# Patient Record
Sex: Male | Born: 1965 | Race: Asian | Hispanic: No | Marital: Married | Smoking: Never smoker
Health system: Southern US, Community
[De-identification: ages and names within clinical notes are randomized; demographics above are authoritative.]

## PROBLEM LIST (undated history)

## (undated) DIAGNOSIS — B1910 Unspecified viral hepatitis B without hepatic coma: Secondary | ICD-10-CM

## (undated) HISTORY — DX: Unspecified viral hepatitis B without hepatic coma: B19.10

---

## 2004-08-15 ENCOUNTER — Ambulatory Visit: Payer: Self-pay | Admitting: Internal Medicine

## 2006-04-12 ENCOUNTER — Ambulatory Visit: Payer: Self-pay | Admitting: Gastroenterology

## 2006-04-29 ENCOUNTER — Ambulatory Visit (HOSPITAL_COMMUNITY): Admission: RE | Admit: 2006-04-29 | Discharge: 2006-04-29 | Payer: Self-pay | Admitting: Gastroenterology

## 2010-06-18 ENCOUNTER — Encounter: Payer: Self-pay | Admitting: Gastroenterology

## 2011-08-14 ENCOUNTER — Ambulatory Visit: Payer: Self-pay

## 2011-08-14 ENCOUNTER — Ambulatory Visit: Payer: Self-pay | Admitting: Internal Medicine

## 2011-08-14 VITALS — BP 105/67 | HR 67 | Temp 98.2°F | Resp 16 | Ht 67.75 in | Wt 128.4 lb

## 2011-08-14 DIAGNOSIS — R059 Cough, unspecified: Secondary | ICD-10-CM

## 2011-08-14 DIAGNOSIS — R071 Chest pain on breathing: Secondary | ICD-10-CM

## 2011-08-14 DIAGNOSIS — B181 Chronic viral hepatitis B without delta-agent: Secondary | ICD-10-CM

## 2011-08-14 DIAGNOSIS — B1910 Unspecified viral hepatitis B without hepatic coma: Secondary | ICD-10-CM

## 2011-08-14 DIAGNOSIS — R05 Cough: Secondary | ICD-10-CM

## 2011-08-14 MED ORDER — METHYLPREDNISOLONE ACETATE 80 MG/ML IJ SUSP
80.0000 mg | Freq: Once | INTRAMUSCULAR | Status: AC
  Administered 2011-08-14: 80 mg via INTRAMUSCULAR

## 2011-08-14 MED ORDER — HYDROCODONE-ACETAMINOPHEN 7.5-500 MG/15ML PO SOLN: 5.0000 mL | Freq: Four times a day (QID) | ORAL | Status: AC | PRN

## 2011-08-14 NOTE — Progress Notes (Signed)
  Subjective:    Patient ID: Hector Salazar, male    DOB: February 04, 1966, 46 y.o.   MRN: [id number]  HPI Has cough, right upper thorax pain. No hemoptysis, sob, wheezing Had resp illness 2 wks ago Lost weight, father had lung cancer and did not smoke   Review of Systems Hepatitis B- ? active   Objective:   Physical Exam  Constitutional: He is oriented to person, place, and time. He appears well-developed and well-nourished. No distress.  HENT:  Right Ear: Tympanic membrane normal.  Left Ear: Tympanic membrane normal.  Nose: Mucosal edema and rhinorrhea present. Right sinus exhibits no frontal sinus tenderness. Left sinus exhibits no frontal sinus tenderness.  Mouth/Throat: Oropharynx is clear and moist.  Cardiovascular: Normal rate, regular rhythm and normal heart sounds.   Pulmonary/Chest: Effort normal and breath sounds normal. No respiratory distress. He has no wheezes. He has no rales. He exhibits tenderness.  Abdominal: Soft.  Musculoskeletal: Normal range of motion.  Neurological: He is alert and oriented to person, place, and time.  Skin: Skin is warm and dry.  Psychiatric: He has a normal mood and affect.  [facility] reading (PRIMARY) by  Dr.Halimah Bewick CXR.NAD            Assessment & Plan:  Cough and back pain Schedule CPE soon Depomedrol 80mg  IM Lortab elixir 6oz prn

## 2011-08-14 NOTE — Patient Instructions (Signed)
Hepatitis B Hepatitis B is a viral infection of the liver. Over half the people who become infected with hepatitis B never feel sick. However, some may later develop long-term liver disease (chronic hepatitis). There are 2 phases of the disease: sudden (acute) and longstanding (chronic). CAUSES Hepatitis B is caused by the hepatitis B virus (HBV). It can enter the body by sharing needles contaminated with blood from an infected person, by sharing intimate items such as toothbrushes and razors, or by sex with an infected person. A baby can get HBV from its birth mother. A caregiver may also get it from exposure to the blood of an infected patient by way of a cut or needle stick.  SYMPTOMS Acute Phase Many cases of acute HBV infection are mild and cause few problems.Some people may not even realize they are sick.Symptoms in others may last a few weeks to several months and include:  Loss of appetite.   Feeling very tired.   Nausea.   Vomiting.   Abdominal pain.   Dark yellow urine.   Yellow skin and eyes (jaundice).  Chronic Phase  About 5% of people who get HBV infection become "chronic carriers." They often have no symptoms, but the virus stays in their body. They may spread the virus to others and can get long-term liver disease. The younger a child is when the infection starts, the more likely that child will be a carrier.   About 25% of chronic HBV carriers get a disease called "chronic active hepatitis." These people may develop scarring of the liver (cirrhosis), liver failure, or liver cancer.  DIAGNOSIS Your caregiver can do a blood test to see if you have the disease. TREATMENT Acute hepatitis B does not usually require any drug treatment. It is important to avoid medicines such as acetaminophen that may cause increasing liver damage.  Treatment with many antiviral drugs is available and recommended for some patients with hepatitis B infection. The goal is to reduce the risk of  progressive chronic liver disease, transmission of infection to others, and other long-term complications such as cirrhosis, liver failure, and liver cancer. Drug treatment is often advised for people with:  Acute liver failure.   Clinical complications of cirrhosis.   Cirrhosis or advanced fibrosis with high serum measurements of viral DNA.   Reactivation of chronic HBV after chemotherapy or immunosuppression.  Immediate drug treatment is not often advised for patients who have chronic infection but normal liver enzyme tests or patients who have a positive hepatitis B DNA test in blood but no other signs of active infection.Patients may have other circumstances that suggest a need or potential benefit from drug treatment. Successful treatment currently requires taking treatment drugs over a long period of time. An injected drug (interferon) may be given daily, 3 times a week, or once weekly for up to 1 year. An oral drug treatment plan may require daily dosing for many years or indefinitely, in order to prevent infection reactivation and worsening of liver disease. Side effects from these drugs are common and some may be very serious. Your response to treatment must be carefully monitored by both you and your caregiver throughout the entire treatment period. PREVENTION Hepatitis B vaccine is highly effective in preventing a hepatitis B infection.The vaccine is recommended worldwide for all newborns of hepatitis B infected mothers, and in many countries for all newborns. Hepatitis B vaccine is also recommended in the [place] for other people at higher than normal risk of getting an infection, including:  Sexually active people with multiple sex partners.   Homosexual and bisexual men.   People who live with someone who has hepatitis B.   Injection drug users.   Healthcare workers.   Patients on chronic hemodialysis and patients who need repeated blood or blood product transfusions.    Patients with chronic liver disease due to any cause.   Unvaccinated people traveling to areas with high levels of local HBV infection.   Patients with diabetes.  Hepatitis B immune globulin (HBIG) is often given with hepatitis B vaccine to people who have been exposed to blood contaminated with HBV. The HBIG protects you from the virus for the first 1 to 3 months. After that, the hepatitis B vaccine takes over and gives you long-term protection. Your caregiver will help you decide whether and when to get these shots following exposure to HBV. Healthcare workers need to avoid injuries and wear appropriate protective equipment such as gloves, gowns, and face masks when performing invasive medical or nursing procedures.  HOME CARE INSTRUCTIONS   Rest when you feel tired, and eat when you are hungry.   Avoid a sexual relationship until advised otherwise by your caregiver.   Avoid activities that could expose other people to your blood. Examples include sharing a toothbrush, nail clippers, razors, and needles.   This infection is contagious. Follow your caregiver's instructions in order to avoid spread of the infection.   Do not take any medicines until your caregiver says it is okay. This includes over-the-counter drugs such as acetominophen that are usually taken for fever or pain.  SEEK IMMEDIATE MEDICAL CARE IF:   You are unable to eat or drink.   You feel sick to your stomach (nauseous) or throw up (vomit).   You feel confused.   Jaundice becomes more severe.   You have trouble breathing, a rash, or swelling of the skin, throat, mouth, or face. You may be having an allergic reaction to the medicine in the shot.   You start twitching or shaking (seizure).   You become very sleepy or have trouble waking up.  MAKE SURE YOU:   Understand these instructions.   Will watch your condition.   Will get help right away if you are not doing well or get worse.  Document Released:  05/11/2000 Document Revised: 05/03/2011 Document Reviewed: 09/12/2010 [facility] Patient Information 2012 [place], [place].

## 2012-02-04 ENCOUNTER — Ambulatory Visit (INDEPENDENT_AMBULATORY_CARE_PROVIDER_SITE_OTHER): Payer: BC Managed Care – PPO | Admitting: Internal Medicine

## 2012-02-04 ENCOUNTER — Encounter: Payer: Self-pay | Admitting: Internal Medicine

## 2012-02-04 ENCOUNTER — Ambulatory Visit: Payer: BC Managed Care – PPO

## 2012-02-04 VITALS — BP 108/74 | HR 63 | Temp 97.8°F | Resp 16 | Ht 68.0 in | Wt 131.0 lb

## 2012-02-04 DIAGNOSIS — Z0000 Encounter for general adult medical examination without abnormal findings: Secondary | ICD-10-CM

## 2012-02-04 DIAGNOSIS — F809 Developmental disorder of speech and language, unspecified: Secondary | ICD-10-CM

## 2012-02-04 DIAGNOSIS — B1910 Unspecified viral hepatitis B without hepatic coma: Secondary | ICD-10-CM

## 2012-02-04 DIAGNOSIS — Z23 Encounter for immunization: Secondary | ICD-10-CM

## 2012-02-04 DIAGNOSIS — B181 Chronic viral hepatitis B without delta-agent: Secondary | ICD-10-CM

## 2012-02-04 LAB — POCT URINALYSIS DIPSTICK
Glucose, UA: NEGATIVE
Leukocytes, UA: NEGATIVE
Nitrite, UA: NEGATIVE
Protein, UA: NEGATIVE
Urobilinogen, UA: 0.2

## 2012-02-04 LAB — CBC WITH DIFFERENTIAL/PLATELET
Basophils Relative: 1 % (ref 0–1)
Eosinophils Absolute: 0.1 10*3/uL (ref 0.0–0.7)
Eosinophils Relative: 2 % (ref 0–5)
HCT: 43.8 % (ref 39.0–52.0)
Lymphocytes Relative: 26 % (ref 12–46)
Lymphs Abs: 1.7 10*3/uL (ref 0.7–4.0)
MCH: 32 pg (ref 26.0–34.0)
Monocytes Relative: 6 % (ref 3–12)
RBC: 4.97 MIL/uL (ref 4.22–5.81)
RDW: 12.4 % (ref 11.5–15.5)
WBC: 6.5 10*3/uL (ref 4.0–10.5)

## 2012-02-04 LAB — POCT UA - MICROSCOPIC ONLY
Bacteria, U Microscopic: NEGATIVE
RBC, urine, microscopic: NEGATIVE

## 2012-02-04 NOTE — Progress Notes (Signed)
  Subjective:    Patient ID: Hector Salazar, male    DOB: 11/25/1965, 46 y.o.   MRN: [id number]  HPI [place], owns [employer]. Has hepatitis B active Feels good. No smoke and no alcohol use Slender and works hard.      See scanned hx    Review of Systems See scanned ros    Objective:   Physical Exam  Constitutional: He is oriented to person, place, and time. He appears well-developed and well-nourished. No distress.  HENT:  Right Ear: External ear normal.  Left Ear: External ear normal.  Nose: Nose normal.  Mouth/Throat: Oropharynx is clear and moist.  Eyes: EOM are normal. Pupils are equal, round, and reactive to light. No scleral icterus.  Neck: Normal range of motion. Neck supple. No tracheal deviation present. No thyromegaly present.  Cardiovascular: Normal rate, regular rhythm and normal heart sounds.   Pulmonary/Chest: Effort normal and breath sounds normal. No respiratory distress. He exhibits no tenderness.  Abdominal: Soft. Bowel sounds are normal. He exhibits no distension. There is no tenderness. There is no guarding.  Genitourinary: Rectum normal, prostate normal and penis normal.  Lymphadenopathy:    He has no cervical adenopathy.  Neurological: He is alert and oriented to person, place, and time. He has normal reflexes. No cranial nerve deficit. He exhibits normal muscle tone. Coordination normal.  Skin: Skin is warm and dry. No rash noted.  Psychiatric: He has a normal mood and affect. His behavior is normal. Judgment and thought content normal.   Hepatitis screening All routine labs ekg nl       Assessment & Plan:  Refer to [facility] hepatitis clinic  Schedule US liver Hep A 1st immunization/ Flu vaccine

## 2012-02-05 LAB — COMPREHENSIVE METABOLIC PANEL
ALT: 15 U/L (ref 0–53)
AST: 21 U/L (ref 0–37)
Albumin: 4.6 g/dL (ref 3.5–5.2)
Alkaline Phosphatase: 55 U/L (ref 39–117)
Calcium: 9.8 mg/dL (ref 8.4–10.5)
Creat: 0.83 mg/dL (ref 0.50–1.35)
Potassium: 3.9 mEq/L (ref 3.5–5.3)
Total Bilirubin: 0.7 mg/dL (ref 0.3–1.2)
Total Protein: 7.5 g/dL (ref 6.0–8.3)

## 2012-02-05 LAB — MICROALBUMIN, URINE: Microalb, Ur: 0.5 mg/dL (ref 0.00–1.89)

## 2012-02-05 LAB — LIPID PANEL
LDL Cholesterol: 153 mg/dL — ABNORMAL HIGH (ref 0–99)
Triglycerides: 65 mg/dL (ref <150)
VLDL: 13 mg/dL (ref 0–40)

## 2012-02-05 LAB — HEPATITIS B SURFACE ANTIGEN: Hepatitis B Surface Ag: POSITIVE — AB

## 2012-02-05 LAB — HEPATITIS B SURFACE ANTIBODY, QUANTITATIVE: Hepatitis B-Post: 0.2 m[IU]/mL

## 2012-02-06 ENCOUNTER — Telehealth: Payer: Self-pay | Admitting: Radiology

## 2012-02-06 DIAGNOSIS — B1910 Unspecified viral hepatitis B without hepatic coma: Secondary | ICD-10-CM

## 2012-02-06 NOTE — Telephone Encounter (Signed)
I received a sticky note, with orders on it from DrGuest. The orders are put in for patient.

## 2012-02-09 NOTE — Progress Notes (Signed)
  Subjective:    Patient ID: Hector Salazar, male    DOB: 11/16/1965, 46 y.o.   MRN: [id number]  HPI    Review of Systems     Objective:   Physical Exam  [facility] reading (PRIMARY) by  Dr Perrin Maltese xr nl        Assessment & Plan:

## 2012-02-29 ENCOUNTER — Other Ambulatory Visit: Payer: Self-pay | Admitting: Internal Medicine

## 2012-02-29 ENCOUNTER — Ambulatory Visit (HOSPITAL_COMMUNITY)
Admission: RE | Admit: 2012-02-29 | Discharge: 2012-02-29 | Disposition: A | Discharge status: Home / Self Care | Payer: BC Managed Care – PPO | Source: Ambulatory Visit | Attending: Internal Medicine | Admitting: Internal Medicine

## 2012-02-29 DIAGNOSIS — B1910 Unspecified viral hepatitis B without hepatic coma: Secondary | ICD-10-CM

## 2012-03-17 ENCOUNTER — Ambulatory Visit: Payer: BC Managed Care – PPO | Admitting: Internal Medicine

## 2012-03-21 ENCOUNTER — Encounter: Payer: Self-pay | Admitting: Internal Medicine

## 2012-03-21 ENCOUNTER — Ambulatory Visit (INDEPENDENT_AMBULATORY_CARE_PROVIDER_SITE_OTHER): Payer: BC Managed Care – PPO | Admitting: Internal Medicine

## 2012-03-21 VITALS — BP 103/72 | HR 60 | Temp 97.4°F | Resp 16 | Ht 68.0 in | Wt 132.0 lb

## 2012-03-21 DIAGNOSIS — Z719 Counseling, unspecified: Secondary | ICD-10-CM

## 2012-03-21 DIAGNOSIS — E785 Hyperlipidemia, unspecified: Secondary | ICD-10-CM

## 2012-03-21 DIAGNOSIS — B1910 Unspecified viral hepatitis B without hepatic coma: Secondary | ICD-10-CM

## 2012-03-21 DIAGNOSIS — E7800 Pure hypercholesterolemia, unspecified: Secondary | ICD-10-CM

## 2012-03-21 DIAGNOSIS — B181 Chronic viral hepatitis B without delta-agent: Secondary | ICD-10-CM

## 2012-03-21 DIAGNOSIS — R7989 Other specified abnormal findings of blood chemistry: Secondary | ICD-10-CM

## 2012-03-21 NOTE — Patient Instructions (Addendum)
Fat and Cholesterol Control Diet Cholesterol levels in your body are determined significantly by your diet. Cholesterol levels may also be related to heart disease. The following material helps to explain this relationship and discusses what you can do to help keep your heart healthy. Not all cholesterol is bad. Low-density lipoprotein (LDL) cholesterol is the "bad" cholesterol. It may cause fatty deposits to build up inside your arteries. High-density lipoprotein (HDL) cholesterol is "good." It helps to remove the "bad" LDL cholesterol from your blood. Cholesterol is a very important risk factor for heart disease. Other risk factors are high blood pressure, smoking, stress, heredity, and weight. The heart muscle gets its supply of blood through the coronary arteries. If your LDL cholesterol is high and your HDL cholesterol is low, you are at risk for having fatty deposits build up in your coronary arteries. This leaves less room through which blood can flow. Without sufficient blood and oxygen, the heart muscle cannot function properly and you may feel chest pains (angina pectoris). When a coronary artery closes up entirely, a part of the heart muscle may die causing a heart attack (myocardial infarction). CHECKING CHOLESTEROL When your caregiver sends your blood to a lab to be examined for cholesterol, a complete lipid (fat) profile may be done. With this test, the total amount of cholesterol and levels of LDL and HDL are determined. Triglycerides are a type of fat that circulates in the blood. They can also be used to determine heart disease risk. The list below describes what the numbers should be: Test: Total Cholesterol.  Less than 200 mg/dl. Test: LDL "bad cholesterol."  Less than 100 mg/dl.  Less than 70 mg/dl if you are at very high risk of a heart attack or sudden cardiac death. Test: HDL "good cholesterol."  Greater than 50 mg/dl for women.  Greater than 40 mg/dl for men. Test:  Triglycerides.  Less than 150 mg/dl. CONTROLLING CHOLESTEROL WITH DIET Although exercise and lifestyle factors are important, your diet is key. That is because certain foods are known to raise cholesterol and others to lower it. The goal is to balance foods for their effect on cholesterol and more importantly, to replace saturated and trans fat with other types of fat, such as monounsaturated fat, polyunsaturated fat, and omega-3 fatty acids. On average, a person should consume no more than 15 to 17 g of saturated fat daily. Saturated and trans fats are considered "bad" fats, and they will raise LDL cholesterol. Saturated fats are primarily found in animal products such as meats, butter, and cream. However, that does not mean you need to give up all your favorite foods. Today, there are good tasting, low-fat, low-cholesterol substitutes for most of the things you like to eat. Choose low-fat or nonfat alternatives. Choose round or loin cuts of red meat. These types of cuts are lowest in fat and cholesterol. Chicken (without the skin), fish, veal, and ground turkey breast are great choices. Eliminate fatty meats, such as hot dogs and salami. Even shellfish have little or no saturated fat. Have a 3 oz (85 g) portion when you eat lean meat, poultry, or fish. Trans fats are also called "partially hydrogenated oils." They are oils that have been scientifically manipulated so that they are solid at room temperature resulting in a longer shelf life and improved taste and texture of foods in which they are added. Trans fats are found in stick margarine, some tub margarines, cookies, crackers, and baked goods.  When baking and cooking, oils   are a great substitute for butter. The monounsaturated oils are especially beneficial since it is believed they lower LDL and raise HDL. The oils you should avoid entirely are saturated tropical oils, such as coconut and palm.  Remember to eat a lot from food groups that are  naturally free of saturated and trans fat, including fish, fruit, vegetables, beans, grains (barley, rice, couscous, bulgur wheat), and pasta (without cream sauces).  IDENTIFYING FOODS THAT LOWER CHOLESTEROL  Soluble fiber may lower your cholesterol. This type of fiber is found in fruits such as apples, vegetables such as broccoli, potatoes, and carrots, legumes such as beans, peas, and lentils, and grains such as barley. Foods fortified with plant sterols (phytosterol) may also lower cholesterol. You should eat at least 2 g per day of these foods for a cholesterol lowering effect.  Read package labels to identify low-saturated fats, trans fat free, and low-fat foods at the supermarket. Select cheeses that have only 2 to 3 g saturated fat per ounce. Use a heart-healthy tub margarine that is free of trans fats or partially hydrogenated oil. When buying baked goods (cookies, crackers), avoid partially hydrogenated oils. Breads and muffins should be made from whole grains (whole-wheat or whole oat flour, instead of "flour" or "enriched flour"). Buy non-creamy canned soups with reduced salt and no added fats.  FOOD PREPARATION TECHNIQUES  Never deep-fry. If you must fry, either stir-fry, which uses very little fat, or use non-stick cooking sprays. When possible, broil, bake, or roast meats, and steam vegetables. Instead of putting butter or margarine on vegetables, use lemon and herbs, applesauce, and cinnamon (for squash and sweet potatoes), nonfat yogurt, salsa, and low-fat dressings for salads.  LOW-SATURATED FAT / LOW-FAT FOOD SUBSTITUTES Meats / Saturated Fat (g)  Avoid: Steak, marbled (3 oz/85 g) / 11 g  Choose: Steak, lean (3 oz/85 g) / 4 g  Avoid: Hamburger (3 oz/85 g) / 7 g  Choose: Hamburger, lean (3 oz/85 g) / 5 g  Avoid: Ham (3 oz/85 g) / 6 g  Choose: Ham, lean cut (3 oz/85 g) / 2.4 g  Avoid: Chicken, with skin, dark meat (3 oz/85 g) / 4 g  Choose: Chicken, skin removed, dark meat (3  oz/85 g) / 2 g  Avoid: Chicken, with skin, light meat (3 oz/85 g) / 2.5 g  Choose: Chicken, skin removed, light meat (3 oz/85 g) / 1 g Dairy / Saturated Fat (g)  Avoid: Whole milk (1 cup) / 5 g  Choose: Low-fat milk, 2% (1 cup) / 3 g  Choose: Low-fat milk, 1% (1 cup) / 1.5 g  Choose: Skim milk (1 cup) / 0.3 g  Avoid: Hard cheese (1 oz/28 g) / 6 g  Choose: Skim milk cheese (1 oz/28 g) / 2 to 3 g  Avoid: Cottage cheese, 4% fat (1 cup) / 6.5 g  Choose: Low-fat cottage cheese, 1% fat (1 cup) / 1.5 g  Avoid: Ice cream (1 cup) / 9 g  Choose: Sherbet (1 cup) / 2.5 g  Choose: Nonfat frozen yogurt (1 cup) / 0.3 g  Choose: Frozen fruit bar / trace  Avoid: Whipped cream (1 tbs) / 3.5 g  Choose: Nondairy whipped topping (1 tbs) / 1 g Condiments / Saturated Fat (g)  Avoid: Mayonnaise (1 tbs) / 2 g  Choose: Low-fat mayonnaise (1 tbs) / 1 g  Avoid: Butter (1 tbs) / 7 g  Choose: Extra light margarine (1 tbs) / 1 g  Avoid: Coconut oil (1   tbs) / 11.8 g  Choose: Olive oil (1 tbs) / 1.8 g  Choose: Corn oil (1 tbs) / 1.7 g  Choose: Safflower oil (1 tbs) / 1.2 g  Choose: Sunflower oil (1 tbs) / 1.4 g  Choose: Soybean oil (1 tbs) / 2.4 g  Choose: Canola oil (1 tbs) / 1 g Document Released: 05/14/2005 Document Revised: 08/06/2011 Document Reviewed: 11/02/2010 [employer] Patient Information 2013 [employer]. Hypothyroidism The thyroid is a large gland located in the lower front of your neck. The thyroid gland helps control metabolism. Metabolism is how your body handles food. It controls metabolism with the hormone thyroxine. When this gland is underactive (hypothyroid), it produces too little hormone.  CAUSES These include:   Absence or destruction of thyroid tissue.  Goiter due to iodine deficiency.  Goiter due to medications.  Congenital defects (since birth).  Problems with the pituitary. This causes a lack of TSH (thyroid stimulating hormone). This hormone tells  the thyroid to turn out more hormone. SYMPTOMS  Lethargy (feeling as though you have no energy)  Cold intolerance  Weight gain (in spite of normal food intake)  Dry skin  Coarse hair  Menstrual irregularity (if severe, may lead to infertility)  Slowing of thought processes Cardiac problems are also caused by insufficient amounts of thyroid hormone. Hypothyroidism in the newborn is cretinism, and is an extreme form. It is important that this form be treated adequately and immediately or it will lead rapidly to retarded physical and mental development. DIAGNOSIS  To prove hypothyroidism, your caregiver may do blood tests and ultrasound tests. Sometimes the signs are hidden. It may be necessary for your caregiver to watch this illness with blood tests either before or after diagnosis and treatment. TREATMENT  Low levels of thyroid hormone are increased by using synthetic thyroid hormone. This is a safe, effective treatment. It usually takes about four weeks to gain the full effects of the medication. After you have the full effect of the medication, it will generally take another four weeks for problems to leave. Your caregiver may start you on low doses. If you have had heart problems the dose may be gradually increased. It is generally not an emergency to get rapidly to normal. HOME CARE INSTRUCTIONS   Take your medications as your caregiver suggests. Let your caregiver know of any medications you are taking or start taking. Your caregiver will help you with dosage schedules.  As your condition improves, your dosage needs may increase. It will be necessary to have continuing blood tests as suggested by your caregiver.  Report all suspected medication side effects to your caregiver. SEEK MEDICAL CARE IF: Seek medical care if you develop:  Sweating.  Tremulousness (tremors).  Anxiety.  Rapid weight loss.  Heat intolerance.  Emotional swings.  Diarrhea.  Weakness. SEEK  IMMEDIATE MEDICAL CARE IF:  You develop chest pain, an irregular heart beat (palpitations), or a rapid heart beat. MAKE SURE YOU:   Understand these instructions.  Will watch your condition.  Will get help right away if you are not doing well or get worse. Document Released: 05/14/2005 Document Revised: 08/06/2011 Document Reviewed: 01/02/2008 [employer] Patient Information 2013 [employer].  

## 2012-03-21 NOTE — Progress Notes (Signed)
  Subjective:    Patient ID: Hector Salazar, male    DOB: [birth year], 46 y.o.   MRN: [id number]  HPI Mild elevation of cholesterol ans tsh, hepatitis b evaluation done by [facility] and no tx needed/us neg. Feels good and has no sxs.   Review of Systems     Objective:   Physical Exam  Constitutional: He is oriented to person, place, and time. He appears well-developed and well-nourished. No distress.  HENT:  Nose: Nose normal.  Mouth/Throat: Oropharynx is clear and moist.  Eyes: EOM are normal. No scleral icterus.  Neck: Neck supple. No thyromegaly present.  Cardiovascular: Normal rate, regular rhythm and normal heart sounds.   Pulmonary/Chest: Effort normal and breath sounds normal.  Abdominal: Soft. Bowel sounds are normal. He exhibits no mass. There is no tenderness.  Neurological: He is alert and oriented to person, place, and time.  Psychiatric: He has a normal mood and affect.    No tests      Assessment & Plan:  High cholesterol with strong HDL High TSH Low cholesterol diet Repeat tsh and lipids 6 mo

## 2012-03-25 ENCOUNTER — Encounter: Payer: Self-pay | Admitting: Family Medicine

## 2012-03-25 DIAGNOSIS — B1910 Unspecified viral hepatitis B without hepatic coma: Secondary | ICD-10-CM

## 2012-04-15 ENCOUNTER — Ambulatory Visit (INDEPENDENT_AMBULATORY_CARE_PROVIDER_SITE_OTHER): Payer: BC Managed Care – PPO | Admitting: Internal Medicine

## 2012-04-15 VITALS — BP 124/76 | HR 74 | Temp 97.6°F | Resp 17 | Ht 68.0 in | Wt 128.0 lb

## 2012-04-15 DIAGNOSIS — J40 Bronchitis, not specified as acute or chronic: Secondary | ICD-10-CM

## 2012-04-15 MED ORDER — AZITHROMYCIN 250 MG PO TABS: ORAL_TABLET | ORAL | Status: DC

## 2012-04-15 MED ORDER — HYDROCODONE-ACETAMINOPHEN 7.5-325 MG/15ML PO SOLN: 15.0000 mL | Freq: Four times a day (QID) | ORAL | Status: DC | PRN

## 2012-04-15 NOTE — Patient Instructions (Signed)

## 2012-04-15 NOTE — Progress Notes (Signed)
  Subjective:    Patient ID: Hector Salazar, male    DOB: 07/18/1965, 46 y.o.   MRN: [id number]  HPI Cough for one week No sob,cp No fever, scant yellow sputum   Review of Systems     Objective:   Physical Exam Normal       Assessment & Plan:  zpak/lortab

## 2012-09-15 ENCOUNTER — Ambulatory Visit: Payer: BC Managed Care – PPO | Admitting: Internal Medicine

## 2012-11-17 ENCOUNTER — Ambulatory Visit: Payer: BC Managed Care – PPO | Admitting: Internal Medicine

## 2013-01-12 ENCOUNTER — Ambulatory Visit (INDEPENDENT_AMBULATORY_CARE_PROVIDER_SITE_OTHER): Payer: BC Managed Care – PPO | Admitting: Internal Medicine

## 2013-01-12 ENCOUNTER — Encounter: Payer: Self-pay | Admitting: Internal Medicine

## 2013-01-12 VITALS — BP 112/72 | HR 62 | Temp 97.9°F | Resp 16 | Ht 68.0 in | Wt 142.0 lb

## 2013-01-12 DIAGNOSIS — B1910 Unspecified viral hepatitis B without hepatic coma: Secondary | ICD-10-CM

## 2013-01-12 DIAGNOSIS — R946 Abnormal results of thyroid function studies: Secondary | ICD-10-CM

## 2013-01-12 DIAGNOSIS — E785 Hyperlipidemia, unspecified: Secondary | ICD-10-CM

## 2013-01-12 DIAGNOSIS — R7989 Other specified abnormal findings of blood chemistry: Secondary | ICD-10-CM

## 2013-01-12 LAB — LIPID PANEL
Cholesterol: 198 mg/dL (ref 0–200)
LDL Cholesterol: 116 mg/dL — ABNORMAL HIGH (ref 0–99)
Total CHOL/HDL Ratio: 4.3 Ratio
Triglycerides: 180 mg/dL — ABNORMAL HIGH (ref <150)
VLDL: 36 mg/dL (ref 0–40)

## 2013-01-12 LAB — COMPREHENSIVE METABOLIC PANEL
AST: 21 U/L (ref 0–37)
Albumin: 4.2 g/dL (ref 3.5–5.2)
Alkaline Phosphatase: 56 U/L (ref 39–117)
Glucose, Bld: 82 mg/dL (ref 70–99)
Potassium: 4 mEq/L (ref 3.5–5.3)
Sodium: 139 mEq/L (ref 135–145)
Total Bilirubin: 0.7 mg/dL (ref 0.3–1.2)
Total Protein: 6.2 g/dL (ref 6.0–8.3)

## 2013-01-12 LAB — CBC
Hemoglobin: 14.6 g/dL (ref 13.0–17.0)
MCH: 31.2 pg (ref 26.0–34.0)
MCHC: 35.2 g/dL (ref 30.0–36.0)
RDW: 13 % (ref 11.5–15.5)

## 2013-01-12 NOTE — Progress Notes (Signed)
  Subjective:    Patient ID: Hector Salazar, male    DOB: Oct 11, 1965, 47 y.o.   MRN: [id number]  HPI Feels well and doing well. 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 No tx needed for hepatitis B. High TSH and Lipids needs repeat   Review of Systems     Objective:   Physical Exam  Vitals reviewed. Constitutional: He is oriented to person, place, and time. He appears well-developed and well-nourished. No distress.  HENT:  Head: Normocephalic.  Eyes: EOM are normal. Pupils are equal, round, and reactive to light. No scleral icterus.  Neck: Neck supple. No thyromegaly present.  Cardiovascular: Normal rate, regular rhythm and normal heart sounds.   Pulmonary/Chest: Effort normal.  Musculoskeletal: Normal range of motion.  Lymphadenopathy:    He has no cervical adenopathy.  Neurological: He is alert and oriented to person, place, and time. He exhibits normal muscle tone. Coordination normal.  Skin: No rash noted.  Psychiatric: He has a normal mood and affect.     TSH/Lipids reck     Assessment & Plan:  Schedule [place] of liver for Hepatitis B eval for [facility]

## 2013-01-12 NOTE — Patient Instructions (Signed)
DASH Diet  The DASH diet stands for "Dietary Approaches to Stop Hypertension." It is a healthy eating plan that has been shown to reduce high blood pressure (hypertension) in as little as 14 days, while also possibly providing other significant health benefits. These other health benefits include reducing the risk of breast cancer after menopause and reducing the risk of type 2 diabetes, heart disease, colon cancer, and stroke. Health benefits also include weight loss and slowing kidney failure in patients with chronic kidney disease.   DIET GUIDELINES  · Limit salt (sodium). Your diet should contain less than 1500 mg of sodium daily.  · Limit refined or processed carbohydrates. Your diet should include mostly whole grains. Desserts and added sugars should be used sparingly.  · Include small amounts of heart-healthy fats. These types of fats include nuts, oils, and tub margarine. Limit saturated and trans fats. These fats have been shown to be harmful in the body.  CHOOSING FOODS   The following food groups are based on a 2000 calorie diet. See your Registered Dietitian for individual calorie needs.  Grains and Grain Products (6 to 8 servings daily)  · Eat More Often: Whole-wheat bread, brown rice, whole-grain or wheat pasta, quinoa, popcorn without added fat or salt (air popped).  · Eat Less Often: White bread, white pasta, white rice, cornbread.  Vegetables (4 to 5 servings daily)  · Eat More Often: Fresh, frozen, and canned vegetables. Vegetables may be raw, steamed, roasted, or grilled with a minimal amount of fat.  · Eat Less Often/Avoid: Creamed or fried vegetables. Vegetables in a cheese sauce.  Fruit (4 to 5 servings daily)  · Eat More Often: All fresh, canned (in natural juice), or frozen fruits. Dried fruits without added sugar. One hundred percent fruit juice (½ cup [237 mL] daily).  · Eat Less Often: Dried fruits with added sugar. Canned fruit in light or heavy syrup.  Lean Meats, Fish, and Poultry (2  servings or less daily. One serving is 3 to 4 oz [85-114 g]).  · Eat More Often: Ninety percent or leaner ground beef, tenderloin, sirloin. Round cuts of beef, chicken breast, turkey breast. All fish. Grill, bake, or broil your meat. Nothing should be fried.  · Eat Less Often/Avoid: Fatty cuts of meat, turkey, or chicken leg, thigh, or wing. Fried cuts of meat or fish.  Dairy (2 to 3 servings)  · Eat More Often: Low-fat or fat-free milk, low-fat plain or light yogurt, reduced-fat or part-skim cheese.  · Eat Less Often/Avoid: Milk (whole, 2%). Whole milk yogurt. Full-fat cheeses.  Nuts, Seeds, and Legumes (4 to 5 servings per week)  · Eat More Often: All without added salt.  · Eat Less Often/Avoid: Salted nuts and seeds, canned beans with added salt.  Fats and Sweets (limited)  · Eat More Often: Vegetable oils, tub margarines without trans fats, sugar-free gelatin. Mayonnaise and salad dressings.  · Eat Less Often/Avoid: Coconut oils, palm oils, butter, stick margarine, cream, half and half, cookies, candy, pie.  FOR MORE INFORMATION  The Dash Diet Eating Plan: www.dashdiet.org  Document Released: 05/03/2011 Document Revised: 08/06/2011 Document Reviewed: 05/03/2011  ExitCare® Patient Information ©2014 [employer].

## 2013-01-16 ENCOUNTER — Ambulatory Visit
Admission: RE | Admit: 2013-01-16 | Discharge: 2013-01-16 | Disposition: A | Discharge status: Home / Self Care | Payer: BC Managed Care – PPO | Source: Ambulatory Visit | Attending: Internal Medicine | Admitting: Internal Medicine

## 2013-01-16 DIAGNOSIS — B1910 Unspecified viral hepatitis B without hepatic coma: Secondary | ICD-10-CM

## 2013-02-12 IMAGING — US US ABDOMEN COMPLETE
1 series · 14 of 25 positions shown · non-contrast
Comparison: 04/29/2006

CLINICAL DATA: Hepatitis B

COMPLETE ABDOMINAL ULTRASOUND

[Series 1: us abdomen complete · 0.28mm/px · 14 of 81 slices shown]
[im 1/81]
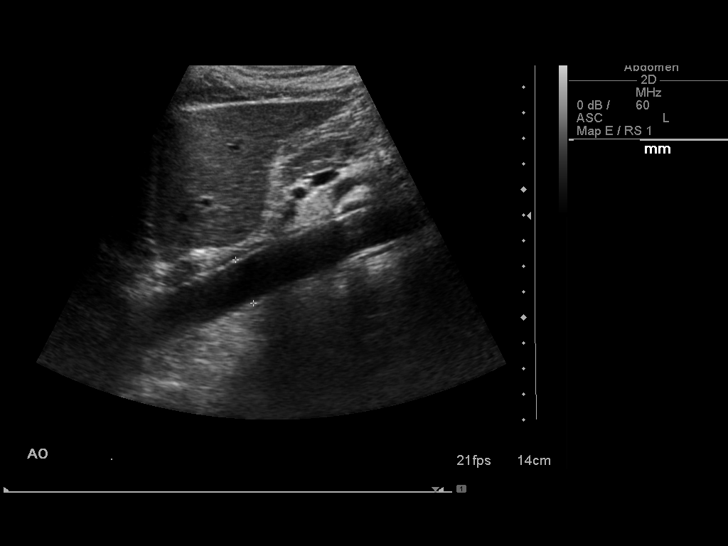
[im 7/81]
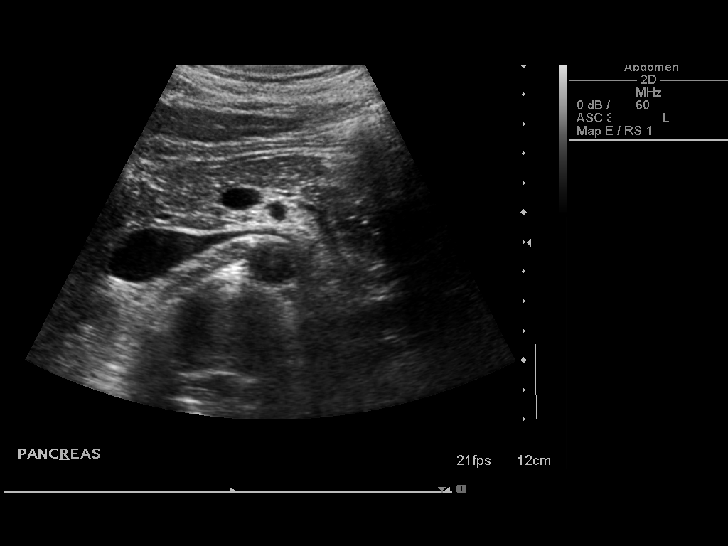
[im 14/81]
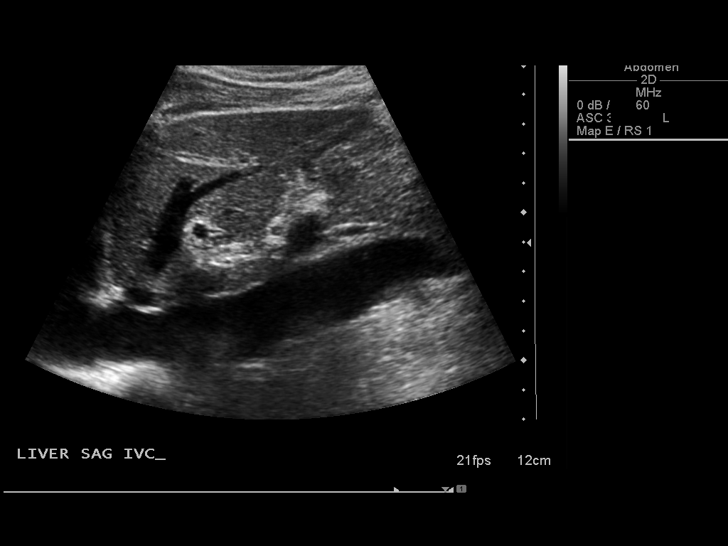
[im 21/81]
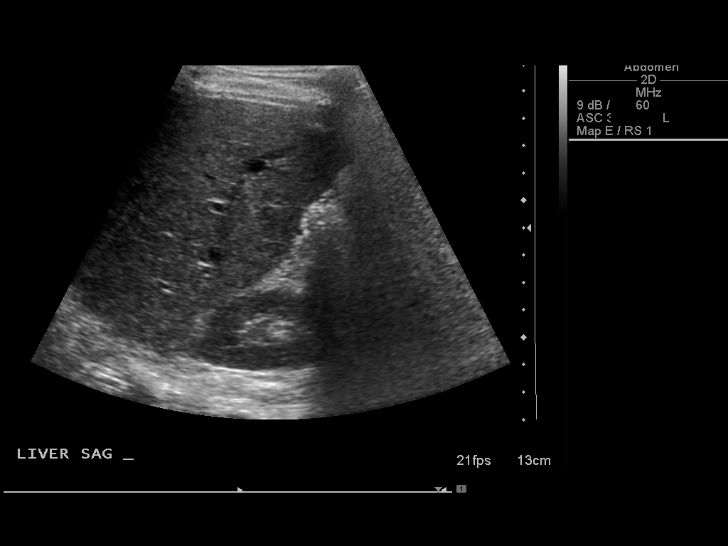
[im 27/81]
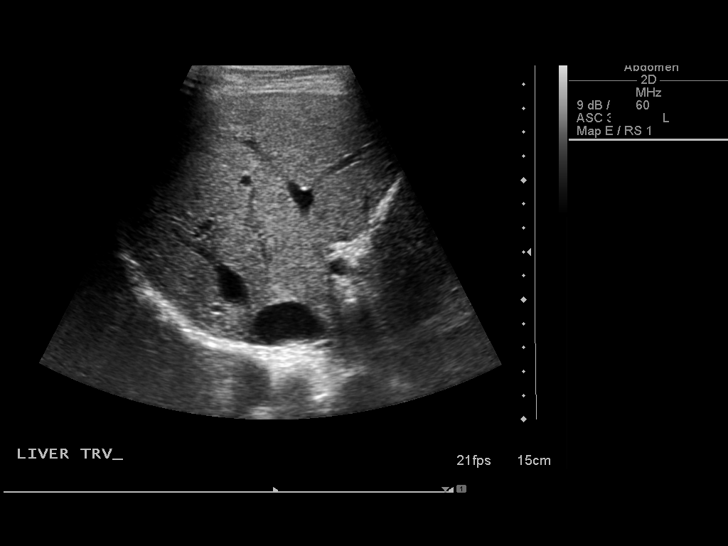
[im 31/81]
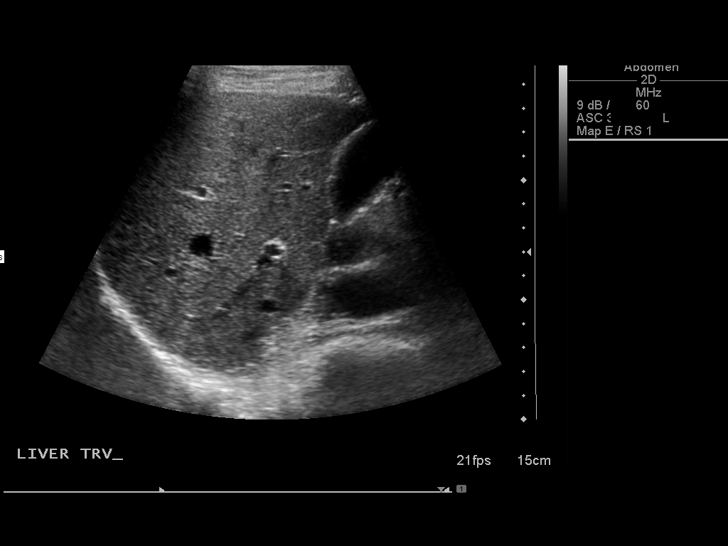
[im 37/81]
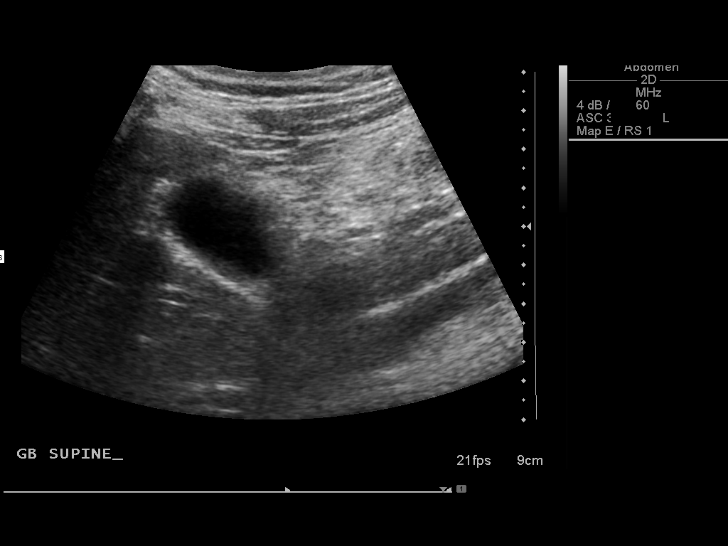
[im 44/81]
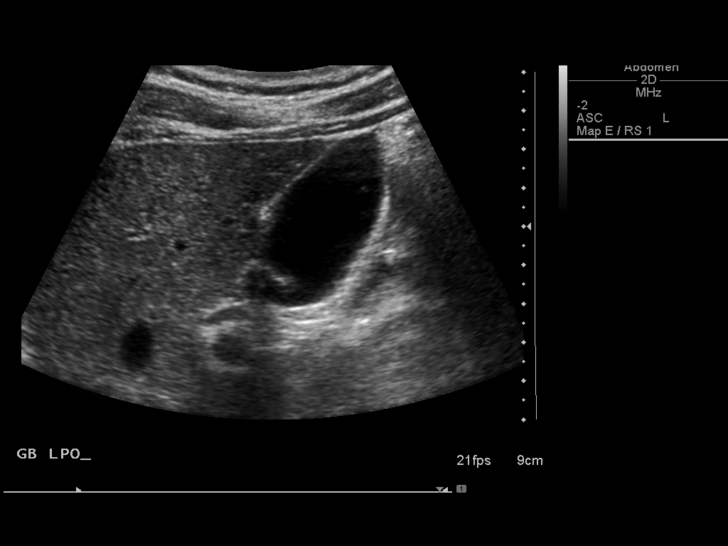
[im 51/81]
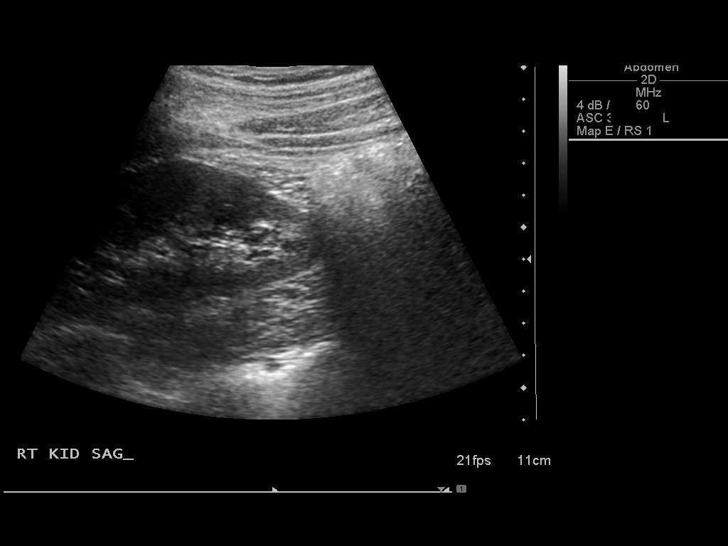
[im 54/81]
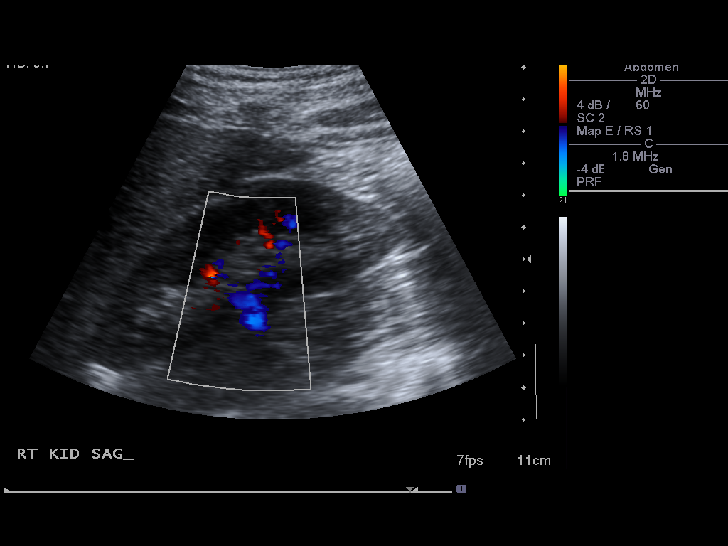
[im 61/81]
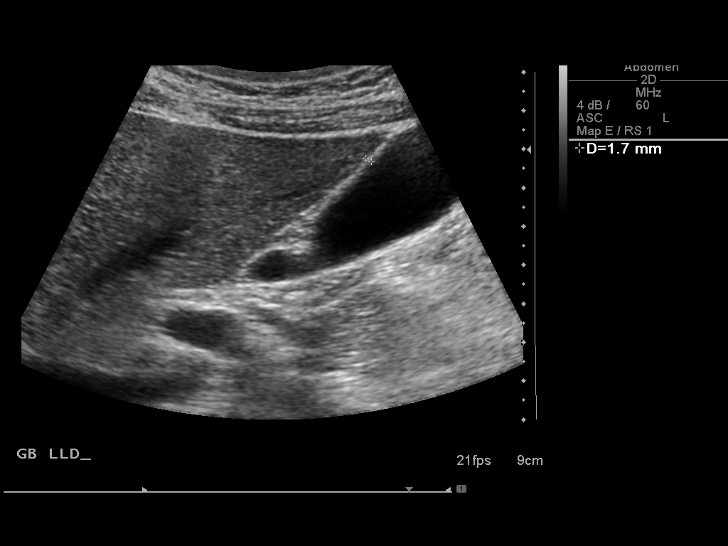
[im 67/81]
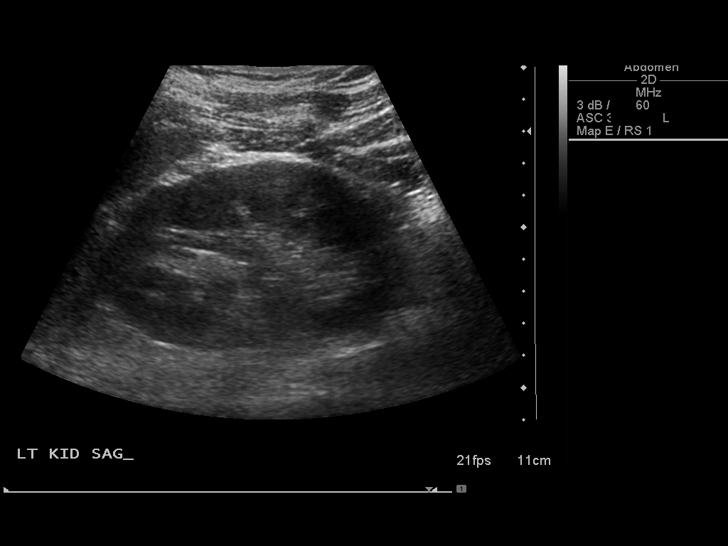
[im 74/81]
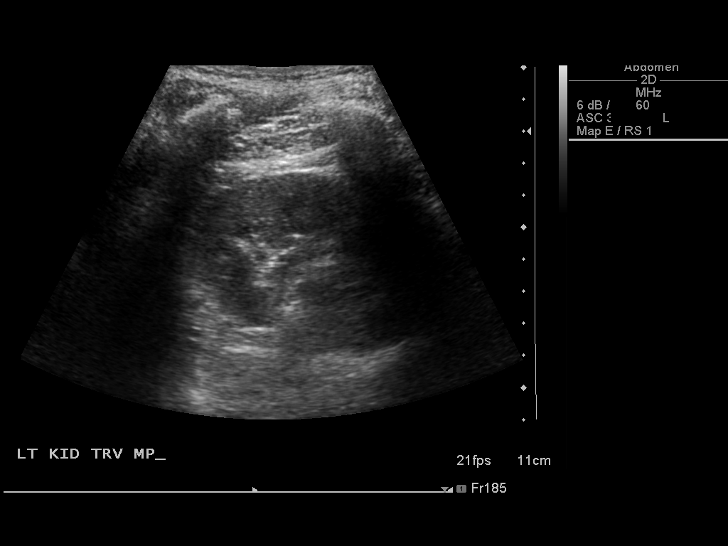
[im 81/81]
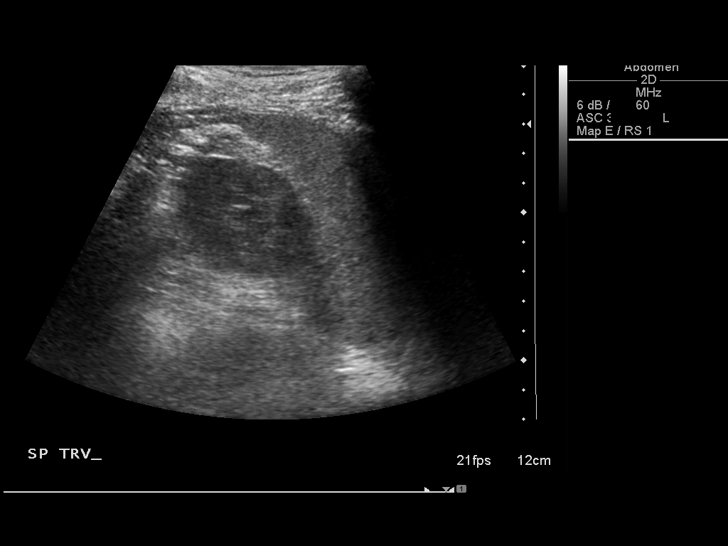

[14 of 25 positions shown; findings below may reference images not displayed]

FINDINGS: Gallbladder:  No gallstones, gallbladder wall thickening, or
pericholecystic fluid. No sonographic Murphy's sign.

Common bile duct:  Measures 3.3 mm in diameter within normal
limits.

Liver:  No focal lesion identified.  Within normal limits in
parenchymal echogenicity.

IVC:  Appears normal.

Pancreas:  No focal abnormality seen.

Spleen:  Measures

Right Kidney:  Measures 10.5 cm in length.  No mass, hydronephrosis
or diagnostic renal calculus

Left Kidney:  Measures 10 cm in length.  No mass, hydronephrosis or
diagnostic renal calculus

Abdominal aorta:  No aneurysm identified. Measures up to 1.8 cm in
diameter.
IMPRESSION: Negative abdominal ultrasound.

## 2013-04-15 ENCOUNTER — Ambulatory Visit (INDEPENDENT_AMBULATORY_CARE_PROVIDER_SITE_OTHER): Payer: BC Managed Care – PPO | Admitting: Family Medicine

## 2013-04-15 VITALS — BP 130/80 | HR 78 | Temp 98.0°F | Resp 16 | Ht 68.0 in | Wt 140.0 lb

## 2013-04-15 DIAGNOSIS — R300 Dysuria: Secondary | ICD-10-CM

## 2013-04-15 DIAGNOSIS — R059 Cough, unspecified: Secondary | ICD-10-CM

## 2013-04-15 DIAGNOSIS — R05 Cough: Secondary | ICD-10-CM

## 2013-04-15 DIAGNOSIS — N419 Inflammatory disease of prostate, unspecified: Secondary | ICD-10-CM

## 2013-04-15 LAB — POCT URINALYSIS DIPSTICK
Bilirubin, UA: NEGATIVE
Blood, UA: NEGATIVE
Glucose, UA: NEGATIVE
Ketones, UA: NEGATIVE
Leukocytes, UA: NEGATIVE
Nitrite, UA: NEGATIVE
Protein, UA: NEGATIVE
Spec Grav, UA: 1.01
Urobilinogen, UA: 0.2
pH, UA: 6.5

## 2013-04-15 LAB — POCT UA - MICROSCOPIC ONLY
Bacteria, U Microscopic: NEGATIVE
Casts, Ur, LPF, POC: NEGATIVE
Crystals, Ur, HPF, POC: NEGATIVE
Mucus, UA: NEGATIVE
RBC, urine, microscopic: NEGATIVE
WBC, Ur, HPF, POC: NEGATIVE
Yeast, UA: NEGATIVE

## 2013-04-15 MED ORDER — BENZONATATE 100 MG PO CAPS: 100.0000 mg | ORAL_CAPSULE | Freq: Three times a day (TID) | ORAL | Status: DC | PRN

## 2013-04-15 MED ORDER — LEVOFLOXACIN 500 MG PO TABS: 500.0000 mg | ORAL_TABLET | Freq: Every day | ORAL | Status: DC

## 2013-04-15 NOTE — Progress Notes (Signed)
[facility] [address] [place] [address] [phone]  Date:  04/15/2013   Name:  Hector Salazar   DOB:  Jun 15, 1965   MRN:  [id number]  PCP:  Tally Due, MD    Chief Complaint: Cough and Dysuria   History of Present Illness:  Hector Salazar is a 47 y.o. very pleasant male patient who presents with the following:  Here today with illness.   Last week he noted that "it hurt a little bit when I pee."  This lasted for a couple of days and then got better, then returned a couple of days ago.  He is not having a lot of pain; "just mild discomfort.'   He works in [employer] and notes that sometimes he has to hold his urine for a long time due to being busy.  He has not noted any frequency.  He has not seen any blood in his urine.  No penile discharge. He notes that he may have this sort of sx on occasion in the past- no history of UTI or kidney stone.   No fever, no back or abdominal pain  Cough mostly at night for about one week.  The cough is non- productive.   Last week he noted a ST- this is now better.  He is not sneezing or having a runny nose.    He actually did take an antibiotic (yesterday and the day before) from his dentist- amoxicilin   Patient Active Problem List   Diagnosis Date Noted  . Hepatitis B 08/14/2011    Past Medical History  Diagnosis Date  . Hepatitis B     History reviewed. No pertinent past surgical history.  History  Substance Use Topics  . Smoking status: Never Smoker   . Smokeless tobacco: Not on file  . Alcohol Use: Not on file    Family History  Problem Relation Age of Onset  . Cancer Father   . Cancer Maternal Grandfather   . Cancer Paternal Grandmother     No Known Allergies  Medication list has been reviewed and updated.  Current Outpatient Prescriptions on File Prior to Visit  Medication Sig Dispense Refill  . azithromycin (ZITHROMAX) 250 MG tablet Use as directed  6 tablet  0  .  hydrocodone-acetaminophen (HYCET) 7.5-325 MG/15ML solution Take 15 mLs by mouth every 6 (six) hours as needed for pain.  240 mL  0   No current facility-administered medications on file prior to visit.    Review of Systems:  As per HPI- otherwise negative.   Physical Examination: Filed Vitals:   04/15/13 0842  BP: 130/80  Pulse: 78  Temp: 98 F (36.7 C)  Resp: 16   Filed Vitals:   04/15/13 0842  Height: 5\' 8"  (1.727 m)  Weight: 140 lb (63.504 kg)   Body mass index is 21.29 kg/(m^2). Ideal Body Weight: Weight in (lb) to have BMI = 25: 164.1  GEN: WDWN, NAD, Non-toxic, A & O x 3 HEENT: Atraumatic, Normocephalic. Neck supple. No masses, No LAD.  Bilateral TM wnl, oropharynx normal.  PEERL,EOMI.   Ears and Nose: No external deformity. CV: RRR, No M/G/R. No JVD. No thrill. No extra heart sounds. PULM: CTA B, no wheezes, crackles, rhonchi. No retractions. No resp. distress. No accessory muscle use. ABD: S, NT, ND, +BS. No rebound. No HSM. EXTR: No c/c/e NEURO Normal gait.  PSYCH: Normally interactive. Conversant. Not depressed or anxious appearing.  Calm demeanor.  GU: no  penile discharge or tenderness.   Prostate: he has discomfort with exam.    Results for orders placed in visit on 04/15/13  POCT URINALYSIS DIPSTICK      Result Value Range   Color, UA yellow     Clarity, UA clear     Glucose, UA neg     Bilirubin, UA neg     Ketones, UA neg     Spec Grav, UA 1.010     Blood, UA neg     pH, UA 6.5     Protein, UA neg     Urobilinogen, UA 0.2     Nitrite, UA neg     Leukocytes, UA Negative    POCT UA - MICROSCOPIC ONLY      Result Value Range   WBC, Ur, HPF, POC neg     RBC, urine, microscopic neg     Bacteria, U Microscopic neg     Mucus, UA neg     Epithelial cells, urine per micros 0-1     Crystals, Ur, HPF, POC neg     Casts, Ur, LPF, POC neg     Yeast, UA neg      Assessment and Plan: Dysuria - Plan: POCT urinalysis dipstick, POCT UA - Microscopic  Only, Urine culture, GC/Chlamydia Probe Amp, levofloxacin (LEVAQUIN) 500 MG tablet  Cough - Plan: levofloxacin (LEVAQUIN) 500 MG tablet, benzonatate (TESSALON) 100 MG capsule  Prostatitis - Plan: levofloxacin (LEVAQUIN) 500 MG tablet  Suspect that Davionte may actually have prostatitis.  He has been on amoxicillin so urinalysis could be inaccurate.  Await culture and genprobe.  levaquin for 10 days.  Will plan further follow- up pending labs.   Signed Abbe Amsterdam, MD

## 2013-04-15 NOTE — Patient Instructions (Signed)
Your urine appears clear.  I think you may actually have an infection of your prostate.  We are going to treat you with an antibiotic called leavaquin for 10 days; this will also treat any lung infection. I will get back with you regarding the rest of your labs. If you are getting worse or not getting better please let me know If it turns out you were taking something BESIDES amoxicillin over the last couple of days please give me a call

## 2013-04-16 ENCOUNTER — Encounter: Payer: Self-pay | Admitting: Family Medicine

## 2013-04-16 LAB — URINE CULTURE
Colony Count: NO GROWTH
Organism ID, Bacteria: NO GROWTH

## 2014-06-22 ENCOUNTER — Ambulatory Visit (INDEPENDENT_AMBULATORY_CARE_PROVIDER_SITE_OTHER): Payer: BLUE CROSS/BLUE SHIELD | Admitting: Physician Assistant

## 2014-06-22 VITALS — BP 110/83 | HR 70 | Temp 98.0°F | Resp 18 | Ht 68.0 in | Wt 142.0 lb

## 2014-06-22 DIAGNOSIS — Z139 Encounter for screening, unspecified: Secondary | ICD-10-CM

## 2014-06-22 DIAGNOSIS — Z130 Encounter for screening for diseases of the blood and blood-forming organs and certain disorders involving the immune mechanism: Secondary | ICD-10-CM | POA: Diagnosis not present

## 2014-06-22 DIAGNOSIS — Z125 Encounter for screening for malignant neoplasm of prostate: Secondary | ICD-10-CM

## 2014-06-22 DIAGNOSIS — L219 Seborrheic dermatitis, unspecified: Secondary | ICD-10-CM

## 2014-06-22 DIAGNOSIS — Z13220 Encounter for screening for lipoid disorders: Secondary | ICD-10-CM

## 2014-06-22 DIAGNOSIS — B169 Acute hepatitis B without delta-agent and without hepatic coma: Secondary | ICD-10-CM | POA: Diagnosis not present

## 2014-06-22 DIAGNOSIS — Z1329 Encounter for screening for other suspected endocrine disorder: Secondary | ICD-10-CM | POA: Diagnosis not present

## 2014-06-22 DIAGNOSIS — L218 Other seborrheic dermatitis: Secondary | ICD-10-CM

## 2014-06-22 DIAGNOSIS — B1910 Unspecified viral hepatitis B without hepatic coma: Secondary | ICD-10-CM

## 2014-06-22 DIAGNOSIS — Z131 Encounter for screening for diabetes mellitus: Secondary | ICD-10-CM | POA: Diagnosis not present

## 2014-06-22 DIAGNOSIS — Z0000 Encounter for general adult medical examination without abnormal findings: Secondary | ICD-10-CM

## 2014-06-22 DIAGNOSIS — Z1389 Encounter for screening for other disorder: Secondary | ICD-10-CM

## 2014-06-22 LAB — LIPID PANEL
CHOLESTEROL: 258 mg/dL — AB (ref 0–200)
HDL: 67 mg/dL (ref >39)
LDL CALC: 160 mg/dL — AB (ref 0–99)
Total CHOL/HDL Ratio: 3.9 Ratio
Triglycerides: 157 mg/dL — ABNORMAL HIGH (ref <150)
VLDL: 31 mg/dL (ref 0–40)

## 2014-06-22 LAB — POCT UA - MICROSCOPIC ONLY
BACTERIA, U MICROSCOPIC: NEGATIVE
Casts, Ur, LPF, POC: NEGATIVE
Crystals, Ur, HPF, POC: NEGATIVE
Mucus, UA: NEGATIVE
RBC, urine, microscopic: NEGATIVE
WBC, Ur, HPF, POC: NEGATIVE
Yeast, UA: NEGATIVE

## 2014-06-22 LAB — POCT URINALYSIS DIPSTICK
BILIRUBIN UA: NEGATIVE
Blood, UA: NEGATIVE
GLUCOSE UA: NEGATIVE
Ketones, UA: NEGATIVE
Leukocytes, UA: NEGATIVE
Nitrite, UA: NEGATIVE
Protein, UA: NEGATIVE
Spec Grav, UA: 1.015
Urobilinogen, UA: 0.2
pH, UA: 7

## 2014-06-22 LAB — POCT CBC
Granulocyte percent: 61.6 %G (ref 37–80)
HCT, POC: 49.2 % (ref 43.5–53.7)
HEMOGLOBIN: 15.7 g/dL (ref 14.1–18.1)
LYMPH, POC: 1.9 (ref 0.6–3.4)
MCH, POC: 30.8 pg (ref 27–31.2)
MCHC: 31.8 g/dL (ref 31.8–35.4)
MCV: 96.6 fL (ref 80–97)
MID (cbc): 0.4 (ref 0–0.9)
MPV: 7.6 fL (ref 0–99.8)
PLATELET COUNT, POC: 232 10*3/uL (ref 142–424)
POC Granulocyte: 3.6 (ref 2–6.9)
POC LYMPH PERCENT: 31.7 %L (ref 10–50)
POC MID %: 6.7 %M (ref 0–12)
RBC: 5.09 M/uL (ref 4.69–6.13)
RDW, POC: 14.1 %
WBC: 5.9 10*3/uL (ref 4.6–10.2)

## 2014-06-22 LAB — COMPLETE METABOLIC PANEL WITH GFR
ALT: 29 U/L (ref 0–53)
AST: 26 U/L (ref 0–37)
Albumin: 4.8 g/dL (ref 3.5–5.2)
Alkaline Phosphatase: 62 U/L (ref 39–117)
BUN: 12 mg/dL (ref 6–23)
CHLORIDE: 102 meq/L (ref 96–112)
CO2: 31 mEq/L (ref 19–32)
Calcium: 10 mg/dL (ref 8.4–10.5)
Creat: 0.73 mg/dL (ref 0.50–1.35)
GFR, Est African American: >89 mL/min
GFR, Est Non African American: >89 mL/min
GLUCOSE: 98 mg/dL (ref 70–99)
Potassium: 4.4 mEq/L (ref 3.5–5.3)
Sodium: 141 mEq/L (ref 135–145)
Total Bilirubin: 0.5 mg/dL (ref 0.2–1.2)
Total Protein: 7.4 g/dL (ref 6.0–8.3)

## 2014-06-22 LAB — TSH: TSH: 1.633 u[IU]/mL (ref 0.350–4.500)

## 2014-06-22 MED ORDER — TRIAMCINOLONE ACETONIDE 0.1 % EX CREA: 1.0000 "application " | TOPICAL_CREAM | Freq: Two times a day (BID) | CUTANEOUS | Status: DC

## 2014-06-22 NOTE — Patient Instructions (Signed)
I have ordered the triamcinolone acetonide to apply around the nose area.  Do not put on your eyelids.  Do not apply longer than 2 weeks.  You can also use the head and shoulders or purchase selsun blue, dilute with water and wash the face.    The hair can be treated with selsun blue or the head and shoulders as your doing.  You may also apply olive oil during the night, then wash out in the morning.  Keeping you healthy  Get these tests  Blood pressure- Have your blood pressure checked once a year by your healthcare provider.  Normal blood pressure is 120/80.  Weight- Have your body mass index (BMI) calculated to screen for obesity.  BMI is a measure of body fat based on height and weight. You can also calculate your own BMI at https://www.west-esparza.com/www.nhlbisupport.com/bmi/.  Cholesterol- Have your cholesterol checked regularly starting at age 47, sooner may be necessary if you have diabetes, high blood pressure, if a family member developed heart diseases at an early age or if you smoke.   Chlamydia, HIV, and other sexual transmitted disease- Get screened each year until the age of 47 then within three months of each new sexual partner.  Diabetes- Have your blood sugar checked regularly if you have high blood pressure, high cholesterol, a family history of diabetes or if you are overweight.  Get these vaccines  Flu shot- Every fall.  Tetanus shot- Every 10 years.  Menactra- Single dose; prevents meningitis.  Take these steps  Don't smoke- If you do smoke, ask your healthcare provider about quitting. For tips on how to quit, go to www.smokefree.gov or call 1-800-QUIT-NOW.  Be physically active- Exercise 5 days a week for at least 30 minutes.  If you are not already physically active start slow and gradually work up to 30 minutes of moderate physical activity.  Examples of moderate activity include walking briskly, mowing the yard, dancing, swimming bicycling, etc.  Eat a healthy diet- Eat a variety of  healthy foods such as fruits, vegetables, low fat milk, low fat cheese, yogurt, lean meats, poultry, fish, beans, tofu, etc.  For more information on healthy eating, go to www.thenutritionsource.org  Drink alcohol in moderation- Limit alcohol intake two drinks or less a day.  Never drink and drive.  Dentist- Brush and floss teeth twice daily; visit your dentis twice a year.  Depression-Your emotional health is as important as your physical health.  If you're feeling down, losing interest in things you normally enjoy please talk with your healthcare provider.  Gun Safety- If you keep a gun in your home, keep it unloaded and with the safety lock on.  Bullets should be stored separately.  Helmet use- Always wear a helmet when riding a motorcycle, bicycle, rollerblading or skateboarding.  Safe sex- If you may be exposed to a sexually transmitted infection, use a condom  Seat belts- Seat bels can save your life; always wear one.  Smoke/Carbon Monoxide detectors- These detectors need to be installed on the appropriate level of your home.  Replace batteries at least once a year.  Skin Cancer- When out in the sun, cover up and use sunscreen SPF 15 or higher.  Violence- If anyone is threatening or hurting you, please tell your healthcare provider.

## 2014-06-22 NOTE — Progress Notes (Signed)
MRN: [id number] DOB: 06-22-1965  Subjective:   Hector Salazar is a 49 y.o. male presenting for annual physical exam and have a referral for hepatitis B.  He had a referral appointment, but he had missed it.  He reports no fatigue, constipation, skin changes, jaundice, pruritus, abdominal pain, n/v, melena, or bloating.   Patient also was concerned for having random coughing.  It does not keep him up at night.  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  There is no sob, dyspnea, sweats, fever, or fatigue.  He has a familial hx of father with lung cancer.  Father was not a smoker.  Father lived in [place].    He also has concern of skin irritation on face.  This occurs along the maxilarry, and nasal fold, where the skin becomes inflammed and dry.  This resurfaces with cold weather.  He notes he also has dry scalp.  This has progressively worsened with the winter months.  He washes his hair daily with head and shoulders.    Hector Salazar currently has no medications in their medication list.  He has No Known Allergies.  Jong  has a past medical history of Hepatitis B. Also  has no past surgical history on file.  Review of Systems  Constitutional: Negative for fever and chills.  HENT: Negative for congestion, ear pain and sore throat.   Eyes: Negative for blurred vision.  Respiratory: Negative for cough, shortness of breath and wheezing.   Cardiovascular: Negative for chest pain and palpitations.  Gastrointestinal: Negative for nausea, vomiting, abdominal pain, constipation and melena.  Genitourinary: Negative for dysuria and urgency.  Musculoskeletal: Negative for joint pain.  Skin: Negative for itching and rash.  Neurological: Negative for dizziness, tremors, weakness and headaches.   As in subjective.  Objective:   Vitals: BP 110/83 mmHg  Pulse 70  Temp(Src) 98 F (36.7 C) (Oral)  Resp 18  Ht  (1.727 m)  Wt 142 lb (64.411 kg)  BMI 21.60 kg/m2  SpO2  99%  Physical Exam  Constitutional: He is oriented to person, place, and time and well-developed, well-nourished, and in no distress. No distress.  HENT:  Head: Normocephalic and atraumatic.  Eyes: Conjunctivae and EOM are normal. Pupils are equal, round, and reactive to light. Right eye exhibits no discharge. Left eye exhibits no discharge. No scleral icterus.  Neck: Normal range of motion. Neck supple. No thyromegaly present.  Cardiovascular: Normal rate, regular rhythm, normal heart sounds and intact distal pulses.  Exam reveals no gallop and no friction rub.   No murmur heard. Pulmonary/Chest: Effort normal and breath sounds normal. No respiratory distress. He has no wheezes.  Abdominal: Soft. Normal appearance and bowel sounds are normal. He exhibits no distension and no mass. There is no splenomegaly or hepatomegaly. There is no tenderness. There is negative Murphy's sign.  Musculoskeletal: He exhibits no edema.  Lymphadenopathy:    He has no cervical adenopathy.  Neurological: He is alert and oriented to person, place, and time. He has normal reflexes. He displays normal reflexes. No cranial nerve deficit. He exhibits normal muscle tone. Gait normal. Coordination normal.  Skin: Skin is warm, dry and intact.  No spider angioma or jaundice.  No palmar erythema.  Scalp with flaking, but no erythema, or abrasion.    Psychiatric: Mood and affect normal.    Results for orders placed or performed in visit on 06/22/14  COMPLETE METABOLIC PANEL WITH GFR  Result  Value Ref Range   Sodium 141 135 - 145 mEq/L   Potassium 4.4 3.5 - 5.3 mEq/L   Chloride 102 96 - 112 mEq/L   CO2 31 19 - 32 mEq/L   Glucose, Bld 98 70 - 99 mg/dL   BUN 12 6 - 23 mg/dL   Creat 8.11 9.14 - 7.82 mg/dL   Total Bilirubin 0.5 0.2 - 1.2 mg/dL   Alkaline Phosphatase 62 39 - 117 U/L   AST 26 0 - 37 U/L   ALT 29 0 - 53 U/L   Total Protein 7.4 6.0 - 8.3 g/dL   Albumin 4.8 3.5 - 5.2 g/dL   Calcium 95.6 8.4 - 21.3  mg/dL   GFR, Est African American >89 mL/min   GFR, Est Non African American >89 mL/min  TSH  Result Value Ref Range   TSH 1.633 0.350 - 4.500 uIU/mL  Lipid panel  Result Value Ref Range   Cholesterol 258 (H) 0 - 200 mg/dL   Triglycerides 086 (H) <150 mg/dL   HDL 67 >57 mg/dL   Total CHOL/HDL Ratio 3.9 Ratio   VLDL 31 0 - 40 mg/dL   LDL Cholesterol 846 (H) 0 - 99 mg/dL  PSA  Result Value Ref Range   PSA 0.87 <=4.00 ng/mL  POCT CBC  Result Value Ref Range   WBC 5.9 4.6 - 10.2 K/uL   Lymph, poc 1.9 0.6 - 3.4   POC LYMPH PERCENT 31.7 10 - 50 %L   MID (cbc) 0.4 0 - 0.9   POC MID % 6.7 0 - 12 %M   POC Granulocyte 3.6 2 - 6.9   Granulocyte percent 61.6 37 - 80 %G   RBC 5.09 4.69 - 6.13 M/uL   Hemoglobin 15.7 14.1 - 18.1 g/dL   HCT, POC 96.2 95.2 - 53.7 %   MCV 96.6 80 - 97 fL   MCH, POC 30.8 27 - 31.2 pg   MCHC 31.8 31.8 - 35.4 g/dL   RDW, POC 84.1 %   Platelet Count, POC 232 142 - 424 K/uL   MPV 7.6 0 - 99.8 fL  POCT UA - Microscopic Only  Result Value Ref Range   WBC, Ur, HPF, POC neg    RBC, urine, microscopic neg    Bacteria, U Microscopic neg    Mucus, UA neg    Epithelial cells, urine per micros 0-1    Crystals, Ur, HPF, POC neg    Casts, Ur, LPF, POC neg    Yeast, UA negf   POCT urinalysis dipstick  Result Value Ref Range   Color, UA yellow    Clarity, UA clear    Glucose, UA neg    Bilirubin, UA neg    Ketones, UA neg    Spec Grav, UA 1.015    Blood, UA neg    pH, UA 7.0    Protein, UA neg    Urobilinogen, UA 0.2    Nitrite, UA neg    Leukocytes, UA Negative       Assessment and Plan :  49 year old male is here today for annual physical exam and new referral to GI.  Patient appears healthy with hx and exam.  We will refer to GI hepatologist specialist for further work up of liver disease.  Patient declines flu and tdap vaccine today.  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  Annual physical  exam - Plan: POCT CBC, COMPLETE  METABOLIC PANEL WITH GFR, TSH, Lipid panel, POCT UA - Microscopic Only, POCT urinalysis dipstick, PSA Screening for deficiency anemia - Plan: POCT CBC Screening for lipid disorders - Plan: Lipid panel Screening for thyroid disorder - Plan: TSH Screening for blood or protein in urine - Plan: POCT UA - Microscopic Only, POCT urinalysis dipstick Prostate cancer screening - Plan: PSA Screening for diabetes mellitus - Plan: COMPLETE METABOLIC PANEL WITH GFR  Hepatitis B virus infection, unspecified chronicity  Ambulatory referral to Gastroenterology appreciated at this time for US and HCC prevention diagnostics  Seborrheic dermatitis  triamcinolone cream (KENALOG) 0.1 % -Advised to apply olive oil during the night and wash with head and shoulders or selsun blue -Also advised to dilute head and shoulders with water.   Trena PlattStephanie English, PA-C [facility] 1/28/201612:49 AM

## 2014-06-23 LAB — PSA: PSA: 0.87 ng/mL (ref <=4.00)

## 2014-06-29 ENCOUNTER — Encounter: Payer: Self-pay | Admitting: Physician Assistant

## 2014-10-27 ENCOUNTER — Other Ambulatory Visit: Payer: Self-pay | Admitting: Physician Assistant

## 2014-10-27 DIAGNOSIS — B1910 Unspecified viral hepatitis B without hepatic coma: Secondary | ICD-10-CM

## 2014-11-09 ENCOUNTER — Ambulatory Visit
Admission: RE | Admit: 2014-11-09 | Discharge: 2014-11-09 | Disposition: A | Discharge status: Home / Self Care | Payer: BLUE CROSS/BLUE SHIELD | Source: Ambulatory Visit | Attending: Physician Assistant | Admitting: Physician Assistant

## 2014-11-09 DIAGNOSIS — B1910 Unspecified viral hepatitis B without hepatic coma: Secondary | ICD-10-CM

## 2015-05-31 ENCOUNTER — Ambulatory Visit (INDEPENDENT_AMBULATORY_CARE_PROVIDER_SITE_OTHER): Payer: BLUE CROSS/BLUE SHIELD | Admitting: Physician Assistant

## 2015-05-31 VITALS — BP 104/72 | HR 63 | Temp 97.8°F | Resp 16 | Ht 68.0 in | Wt 133.0 lb

## 2015-05-31 DIAGNOSIS — J209 Acute bronchitis, unspecified: Secondary | ICD-10-CM

## 2015-05-31 MED ORDER — HYDROCOD POLST-CPM POLST ER 10-8 MG/5ML PO SUER: 5.0000 mL | Freq: Two times a day (BID) | ORAL | Status: DC | PRN

## 2015-05-31 MED ORDER — BENZONATATE 100 MG PO CAPS: 100.0000 mg | ORAL_CAPSULE | Freq: Three times a day (TID) | ORAL | Status: DC | PRN

## 2015-05-31 NOTE — Patient Instructions (Signed)
Take tessalon during the day. Take cough syrup at night Drink plenty of water (64 oz/day) and get plenty of rest. If your symptoms are not improving in 1-2 weeks, return to clinic.

## 2015-05-31 NOTE — Progress Notes (Signed)
[facility] [address], [place] [address] [phone]  Date:  05/31/2015   Name:  Hector Salazar   DOB:  March 29, 1966   MRN:  [id number]  PCP:  Tally Due, MD    Chief Complaint: Cough   History of Present Illness:  This is a 50 y.o. male with PMH Hep B who is presenting with cough x 2 weeks. States it is worse at night. Cough was initially productive, becoming dry. Denies sob or wheezing. Throat feels "a little bit itchy". Denies otalgia, fever, chills. He states his father was dx'd with lung cancer 6-7 years ago. He is wondering if he needs an xray. He feels he has lost a few pounds since illness began. Otherwise, no weight loss leading up to illness. No night sweats. He is not a smoker.  Aggravating/alleviating factors: tried delsym without any help.  History of asthma: no History of env allergies: doesn't think so  Review of Systems:  Review of Systems See HPI  Patient Active Problem List   Diagnosis Date Noted  . Hepatitis B 08/14/2011    Prior to Admission medications   Medication Sig Start Date End Date Taking? Authorizing Provider  triamcinolone cream (KENALOG) 0.1 % Apply 1 application topically 2 (two) times daily. Do not place on eyelids.  Do not use more than 2 weeks consistently. 06/22/14  Yes Stephanie D English, PA    No Known Allergies  History reviewed. No pertinent past surgical history.  Social History  Substance Use Topics  . Smoking status: Never Smoker   . Smokeless tobacco: None  . Alcohol Use: None    Family History  Problem Relation Age of Onset  . Cancer Father   . Cancer Maternal Grandfather   . Cancer Paternal Grandmother     Medication list has been reviewed and updated.  Physical Examination:  Physical Exam  Constitutional: He is oriented to person, place, and time. He appears well-developed and well-nourished. No distress.  HENT:  Head: Normocephalic and atraumatic.  Right Ear: Hearing, tympanic  membrane, external ear and ear canal normal.  Left Ear: Hearing, tympanic membrane, external ear and ear canal normal.  Nose: Nose normal.  Mouth/Throat: Uvula is midline, oropharynx is clear and moist and mucous membranes are normal.  Eyes: Conjunctivae and lids are normal. Right eye exhibits no discharge. Left eye exhibits no discharge. No scleral icterus.  Cardiovascular: Normal rate, regular rhythm, normal heart sounds and normal pulses.   No murmur heard. Pulmonary/Chest: Effort normal and breath sounds normal. No respiratory distress. He has no wheezes. He has no rhonchi. He has no rales.  Musculoskeletal: Normal range of motion.  Lymphadenopathy:       Head (right side): No submental, no submandibular and no tonsillar adenopathy present.       Head (left side): No submental, no submandibular and no tonsillar adenopathy present.    He has no cervical adenopathy.  Neurological: He is alert and oriented to person, place, and time.  Skin: Skin is warm, dry and intact. No lesion and no rash noted.  Psychiatric: He has a normal mood and affect. His speech is normal and behavior is normal. Thought content normal.    BP 104/72 mmHg  Pulse 63  Temp(Src) 97.8 F (36.6 C)  Resp 16  Ht 5\' 8"  (1.727 m)  Wt 133 lb (60.328 kg)  BMI 20.23 kg/m2  SpO2 98%  Assessment and Plan:  1. Acute bronchitis, unspecified organism Tessalon and tussionex  for likely viral bronchitis. Pt is feeling well other than persistent cough. If symptoms not improving in 1-2 weeks, return for further eval and chest xray. - benzonatate (TESSALON) 100 MG capsule; Take 1-2 capsules (100-200 mg total) by mouth 3 (three) times daily as needed for cough.  Dispense: 40 capsule; Refill: 0 - chlorpheniramine-HYDROcodone (TUSSIONEX PENNKINETIC ER) 10-8 MG/5ML SUER; Take 5 mLs by mouth every 12 (twelve) hours as needed for cough.  Dispense: 100 mL; Refill: 0   Roswell MinersNicole V. Dyke BrackettBush, PA-C, MHS [facility]  05/31/2015

## 2015-06-06 NOTE — Progress Notes (Signed)
  Medical screening examination/treatment/procedure(s) were performed by non-physician practitioner and as supervising physician I was immediately available for consultation/collaboration.     

## 2015-06-21 ENCOUNTER — Ambulatory Visit (INDEPENDENT_AMBULATORY_CARE_PROVIDER_SITE_OTHER): Payer: BLUE CROSS/BLUE SHIELD | Admitting: Family Medicine

## 2015-06-21 VITALS — BP 110/70 | HR 74 | Temp 98.8°F | Resp 20 | Ht 68.0 in | Wt 134.0 lb

## 2015-06-21 DIAGNOSIS — J209 Acute bronchitis, unspecified: Secondary | ICD-10-CM

## 2015-06-21 DIAGNOSIS — R05 Cough: Secondary | ICD-10-CM | POA: Diagnosis not present

## 2015-06-21 DIAGNOSIS — R058 Other specified cough: Secondary | ICD-10-CM

## 2015-06-21 MED ORDER — HYDROCOD POLST-CPM POLST ER 10-8 MG/5ML PO SUER: 5.0000 mL | Freq: Two times a day (BID) | ORAL | Status: DC | PRN

## 2015-06-21 MED ORDER — FLUTICASONE PROPIONATE 50 MCG/ACT NA SUSP: 2.0000 | Freq: Every day | NASAL | Status: DC

## 2015-06-21 MED ORDER — BENZONATATE 200 MG PO CAPS: 200.0000 mg | ORAL_CAPSULE | Freq: Three times a day (TID) | ORAL | Status: DC | PRN

## 2015-06-21 MED ORDER — CETIRIZINE HCL 10 MG PO TABS: 10.0000 mg | ORAL_TABLET | Freq: Every day | ORAL | Status: DC

## 2015-06-21 NOTE — Progress Notes (Signed)
Subjective:  By signing my name below, I, Hector Salazar, attest that this documentation has been prepared under the direction and in the presence of Hector Sorenson, MD.  Hector Salazar, Medical Scribe. 06/21/2015.  3:53 PM.    Patient ID: Hector Salazar, male    DOB: 13-Dec-1965, 50 y.o.   MRN: [id number]  Chief Complaint  Patient presents with  . Follow-up    continue the same symptoms of the last visit, bronchitis     HPI HPI Comments: Hector Salazar is a 50 y.o. male who presents to [facility] for a follow up.  Pt was her 3 weeks ago, seen by Ms. Bush, PA-C, and was diagnosed with bronchitis after 2 weeks of symptoms. Suspected viral infection. Treated with Ttussionex and Tessalon.   Pt reports that his cough is still present with associated nasal congestion. Pt states that he does wake up with a dry mouth at morning time, which in turn causes his cough. He indicates that he did find some relief initially with the medication, especially with his ability to sleep, he was prescribed however his symptoms returned after his course of medication ended. Pt took Benadryl last night for the symptoms. He reports no history of asthma, PNA. Pt denies post nasal drip, chest pain, SOB, indigestion, heart burn, metallic taste in the mouth during morning time, voice changes. Pt is not a smoker.     Patient Active Problem List   Diagnosis Date Noted  . Hepatitis B 08/14/2011   Past Medical History  Diagnosis Date  . Hepatitis B    History reviewed. No pertinent past surgical history. No Known Allergies Prior to Admission medications   Medication Sig Start Date End Date Taking? Authorizing Provider  chlorpheniramine-HYDROcodone (TUSSIONEX PENNKINETIC ER) 10-8 MG/5ML SUER Take 5 mLs by mouth every 12 (twelve) hours as needed for cough. 05/31/15  Yes Lanier Clam V, PA-C  triamcinolone cream (KENALOG) 0.1 % Apply 1 application topically 2 (two) times daily. Do not place on eyelids.  Do not use  more than 2 weeks consistently. 06/22/14  Yes Stephanie D English, PA  benzonatate (TESSALON) 100 MG capsule Take 1-2 capsules (100-200 mg total) by mouth 3 (three) times daily as needed for cough. Patient not taking: Reported on 06/21/2015 05/31/15   Overton Mam   Social History   Social History  . Marital Status: Married    Spouse Name: N/A  . Number of Children: N/A  . Years of Education: N/A   Occupational History  . Not on file.   Social History Main Topics  . Smoking status: Never Smoker   . Smokeless tobacco: Not on file  . Alcohol Use: Not on file  . Drug Use: Not on file  . Sexual Activity: Not on file   Other Topics Concern  . Not on file   Social History Narrative    Review of Systems  HENT: Positive for congestion. Negative for postnasal drip and voice change.   Respiratory: Positive for cough. Negative for shortness of breath.   Cardiovascular: Negative for leg swelling.  Psychiatric/Behavioral: Positive for sleep disturbance.      Objective:   Physical Exam  Constitutional: He is oriented to person, place, and time. He appears well-developed and well-nourished. No distress.  HENT:  Head: Normocephalic and atraumatic.  Right Ear: Tympanic membrane normal.  Left Ear: Tympanic membrane is retracted.  Little nasal mucosa erythema dn edema.  Oropharyngeal erythema, and some post nasal drips.  Eyes: EOM are normal. Pupils are equal, round, and reactive to light.  Neck: Neck supple. No thyromegaly present.  Cardiovascular: Normal rate.   Pulmonary/Chest: Effort normal.  Abdominal:  Excellent air movement.  Lymphadenopathy:    He has no cervical adenopathy.       Right: No supraclavicular adenopathy present.       Left: No supraclavicular adenopathy present.  Neurological: He is alert and oriented to person, place, and time. No cranial nerve deficit.  Skin: Skin is warm and dry.  Psychiatric: He has a normal mood and affect. His behavior is normal.    Nursing note and vitals reviewed.   BP 110/70 mmHg  Pulse 74  Temp(Src) 98.8 F (37.1 C) (Oral)  Resp 20  Ht  (1.727 m)  Wt 134 lb (60.782 kg)  BMI 20.38 kg/m2  SpO2 98%     Assessment & Plan:   1. Post-viral cough syndrome   2. Upper airway cough syndrome   3. Acute bronchitis, unspecified organism     Meds ordered this encounter  Medications  . fluticasone (FLONASE) 50 MCG/ACT nasal spray    Sig: Place 2 sprays into both nostrils at bedtime.    Dispense:  16 g    Refill:  2  . cetirizine (ZYRTEC) 10 MG tablet    Sig: Take 1 tablet (10 mg total) by mouth at bedtime.    Dispense:  30 tablet    Refill:  11  . benzonatate (TESSALON) 200 MG capsule    Sig: Take 1 capsule (200 mg total) by mouth 3 (three) times daily as needed for cough.    Dispense:  60 capsule    Refill:  0  . chlorpheniramine-HYDROcodone (TUSSIONEX PENNKINETIC ER) 10-8 MG/5ML SUER    Sig: Take 5 mLs by mouth every 12 (twelve) hours as needed for cough.    Dispense:  100 mL    Refill:  0    I personally performed the services described in this documentation, which was scribed in my presence. The recorded information has been reviewed and considered, and addended by me as needed.  Hector Sorenson, MD MPH

## 2015-06-21 NOTE — Patient Instructions (Signed)
The most common causes of chronic cough in immunocompetent adults include the following: upper airway cough syndrome (UACS), previously referred to as postnasal drip syndrome (PNDS), which is caused by variety of rhinosinus conditions. It is not uncommon to develop this for several weeks after a case of bronchitis.  It's frequently impossible to sort out how much is chronic nasal congestion, post-nasal drip, and/or freq throat clearing that has increased in irritation from the recent illness.  Try at night allergy medications since this is when cough is the worst - so start zyrtec (cetirizine) and the fluticasone nasal spray - use both of these for at least 2-3 weeks before stopping. Suppress your cough and throat clearing with candy and delsym and tessalon pearles, not throat lozenges or cough drops.  If you are still feeling the catch in your throat - the next step would be to start either protonix, prilosec, prevacid, or nexium 30 minutes before dinner for about 6 weeks to help protect your throat from acid overnight.  Cough, Adult Coughing is a reflex that clears your throat and your airways. Coughing helps to heal and protect your lungs. It is normal to cough occasionally, but a cough that happens with other symptoms or lasts a long time may be a sign of a condition that needs treatment. A cough may last only 2-3 weeks (acute), or it may last longer than 8 weeks (chronic). CAUSES Coughing is commonly caused by:  Breathing in substances that irritate your lungs.  A viral or bacterial respiratory infection.  Allergies.  Asthma.  Postnasal drip.  Smoking.  Acid backing up from the stomach into the esophagus (gastroesophageal reflux).  Certain medicines.  Chronic lung problems, including COPD (or rarely, lung cancer).  Other medical conditions such as heart failure. HOME CARE INSTRUCTIONS  Pay attention to any changes in your symptoms. Take these actions to help with your  discomfort:  Take medicines only as told by your health care provider.  If you were prescribed an antibiotic medicine, take it as told by your health care provider. Do not stop taking the antibiotic even if you start to feel better.  Talk with your health care provider before you take a cough suppressant medicine.  Drink enough fluid to keep your urine clear or pale yellow.  If the air is dry, use a cold steam vaporizer or humidifier in your bedroom or your home to help loosen secretions.  Avoid anything that causes you to cough at work or at home.  If your cough is worse at night, try sleeping in a semi-upright position.  Avoid cigarette smoke. If you smoke, quit smoking. If you need help quitting, ask your health care provider.  Avoid caffeine.  Avoid alcohol.  Rest as needed. SEEK MEDICAL CARE IF:   You have new symptoms.  You cough up pus.  Your cough does not get better after 2-3 weeks, or your cough gets worse.  You cannot control your cough with suppressant medicines and you are losing sleep.  You develop pain that is getting worse or pain that is not controlled with pain medicines.  You have a fever.  You have unexplained weight loss.  You have night sweats. SEEK IMMEDIATE MEDICAL CARE IF:  You cough up blood.  You have difficulty breathing.  Your heartbeat is very fast.   This information is not intended to replace advice given to you by your health care provider. Make sure you discuss any questions you have with your health care provider.  Document Released: 11/10/2010 Document Revised: 02/02/2015 Document Reviewed: 07/21/2014 Elsevier Interactive Patient Education [employer]

## 2015-07-05 ENCOUNTER — Ambulatory Visit (INDEPENDENT_AMBULATORY_CARE_PROVIDER_SITE_OTHER): Payer: BLUE CROSS/BLUE SHIELD | Admitting: Family Medicine

## 2015-07-05 VITALS — BP 100/67 | HR 65 | Temp 98.2°F | Resp 20 | Ht 68.0 in | Wt 128.8 lb

## 2015-07-05 DIAGNOSIS — J209 Acute bronchitis, unspecified: Secondary | ICD-10-CM

## 2015-07-05 MED ORDER — HYDROCOD POLST-CPM POLST ER 10-8 MG/5ML PO SUER: 5.0000 mL | Freq: Two times a day (BID) | ORAL | Status: DC | PRN

## 2015-07-05 MED ORDER — BENZONATATE 200 MG PO CAPS: 200.0000 mg | ORAL_CAPSULE | Freq: Three times a day (TID) | ORAL | Status: DC | PRN

## 2015-07-05 MED ORDER — AZITHROMYCIN 250 MG PO TABS: ORAL_TABLET | ORAL | Status: DC

## 2015-07-05 NOTE — Progress Notes (Signed)
  Subjective:     Hector Salazar is a 50 y.o. male here for evaluation of a cough. Onset of symptoms was 3 weeks ago. Symptoms have been unchanged since that time. The cough is dry and is aggravated by nothing. Associated symptoms include: None. Patient does not have a history of asthma. Patient does not have a history of environmental allergens. Patient has not traveled recently. Patient does not have a history of smoking. Patient has not had a previous chest x-ray. Patient has not had a PPD done.  Has been seen twice in past month for ongoing cough.  No other systemic Sx but cough has been persistent.  Tessalon and Tussinex has worked for him.  No hemopytosis, fever, night sweats or chills.   The following portions of the patient's history were reviewed and updated as appropriate: allergies, current medications, past family history, past medical history, past social history, past surgical history and problem list.  Review of Systems Pertinent items are noted in HPI.    Objective:    Oxygen saturation 98% on room air BP 100/67 mmHg  Pulse 65  Temp(Src) 98.2 F (36.8 C) (Oral)  Resp 20  Ht  (1.727 m)  Wt 128 lb 12.8 oz (58.423 kg)  BMI 19.59 kg/m2  SpO2 98% General appearance: alert, cooperative and appears stated age Throat: lips, mucosa, and tongue normal; teeth and gums normal Chest wall: no tenderness Heart: regular rate and rhythm, S1, S2 normal, no murmur, click, rub or gallop Abdomen: soft, non-tender; bowel sounds normal; no masses,  no organomegaly   Pulm: CTAB   Assessment:    Acute Bronchitis    Plan:    Antibiotics per medication orders. Antitussives per medication orders. Avoid exposure to tobacco smoke and fumes. B-agonist inhaler. Call if shortness of breath worsens, blood in sputum, change in character of cough, development of fever or chills, inability to maintain nutrition and hydration. Avoid exposure to tobacco smoke and fumes.    Will start on Azithromycin  as well due to possibility of atypical PNA with normal lung/oxygenation today.

## 2015-10-24 IMAGING — US US ABDOMEN LIMITED
1 series · 14 of 25 positions shown · non-contrast
Comparison: Abdomen ultrasound 01/16/2013

CLINICAL DATA: 49-year-old male with chronic hepatitis B.
Subsequent encounter.

EXAM:
US ABDOMEN LIMITED - RIGHT UPPER QUADRANT

[Series 1: us abdomen limited · 0.20mm/px · 14 of 47 slices shown]
[im 1/47]
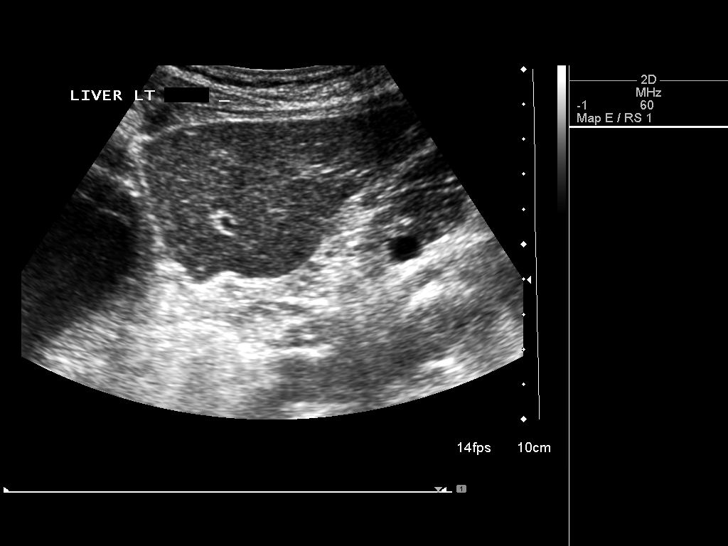
[im 4/47]
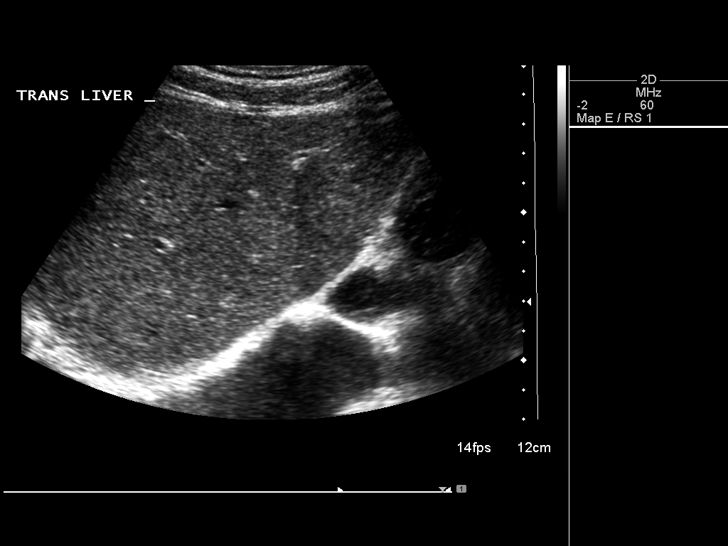
[im 8/47]
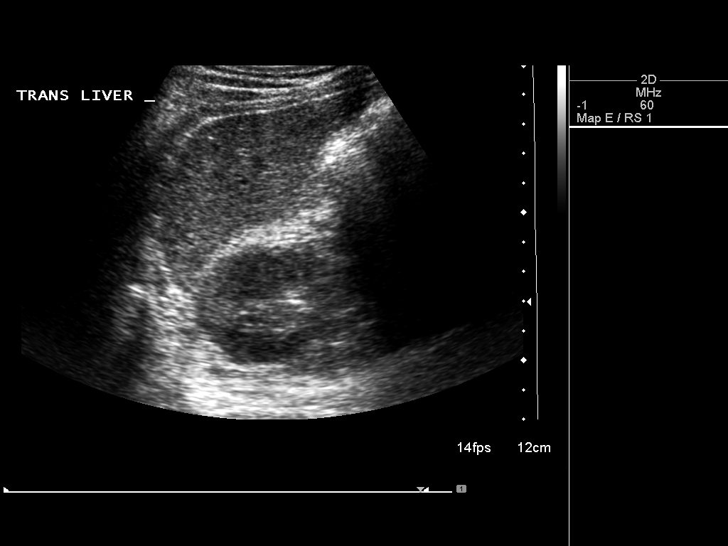
[im 12/47]
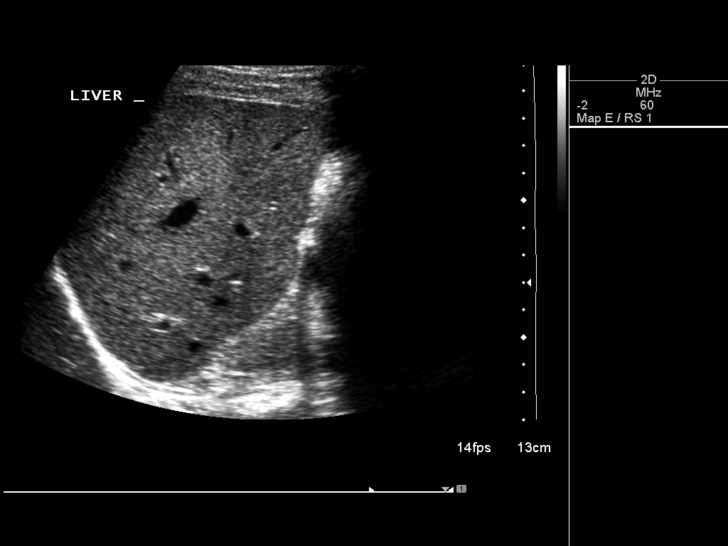
[im 16/47]
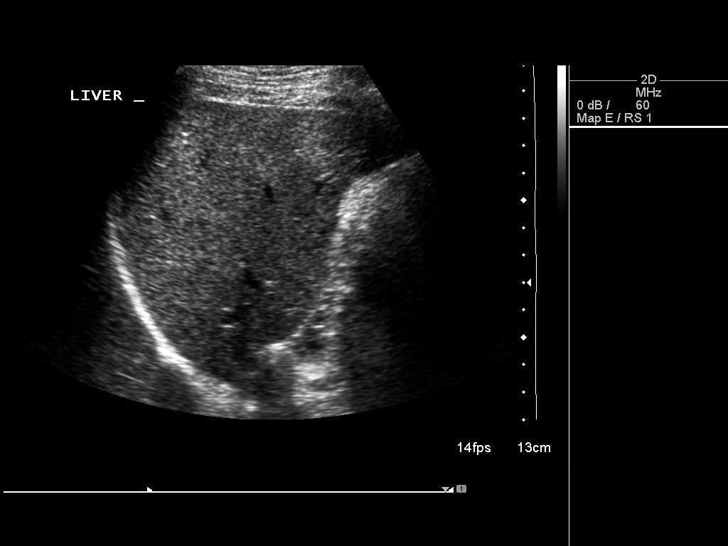
[im 18/47]
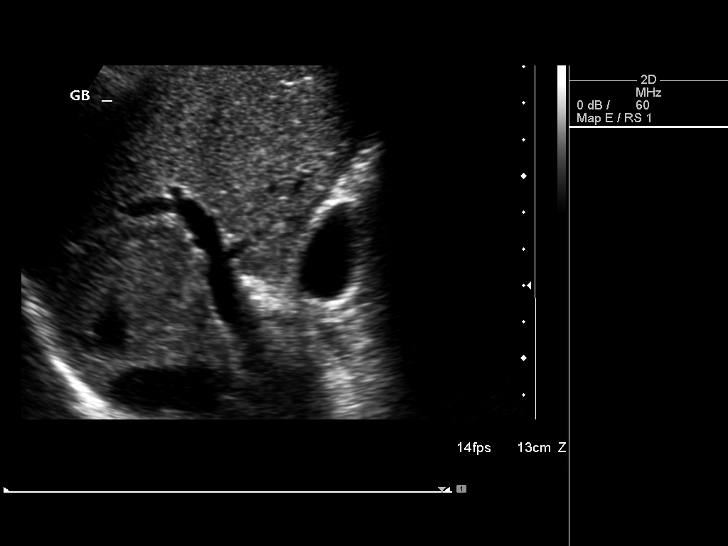
[im 22/47]
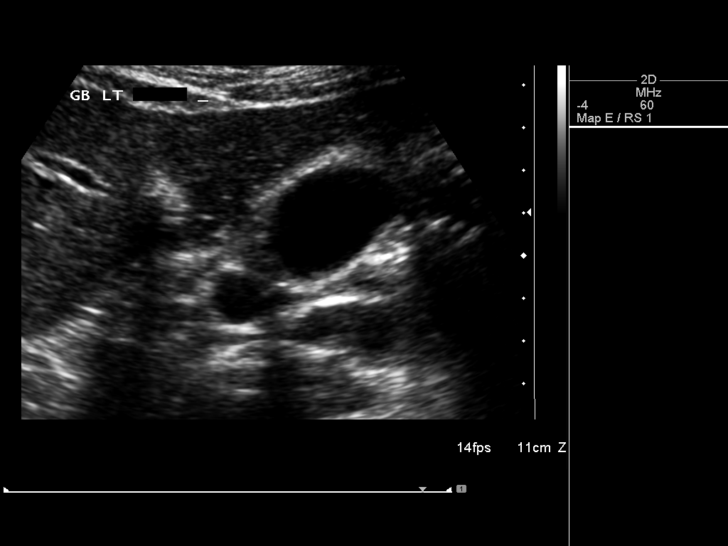
[im 25/47]
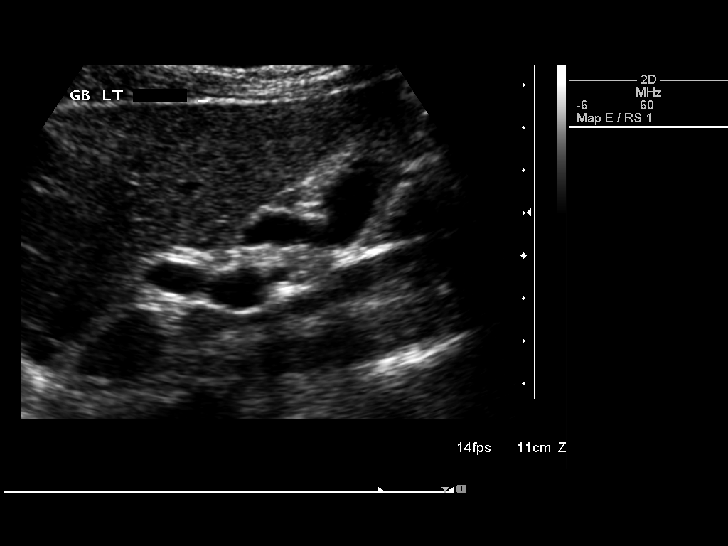
[im 29/47]
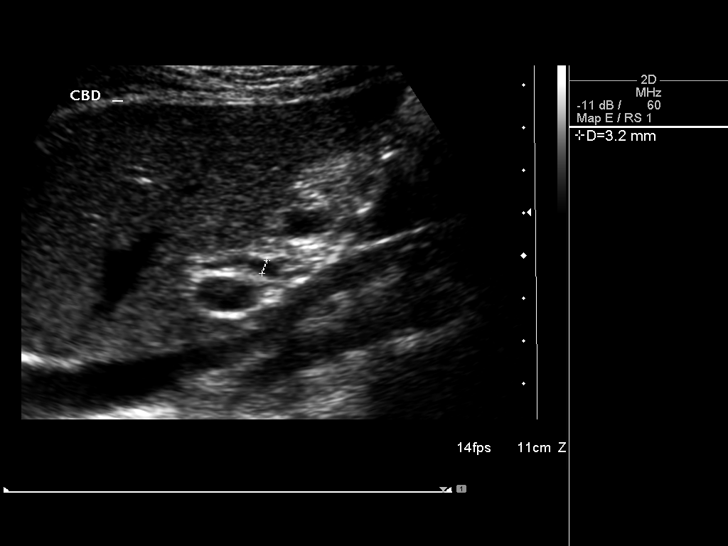
[im 31/47]
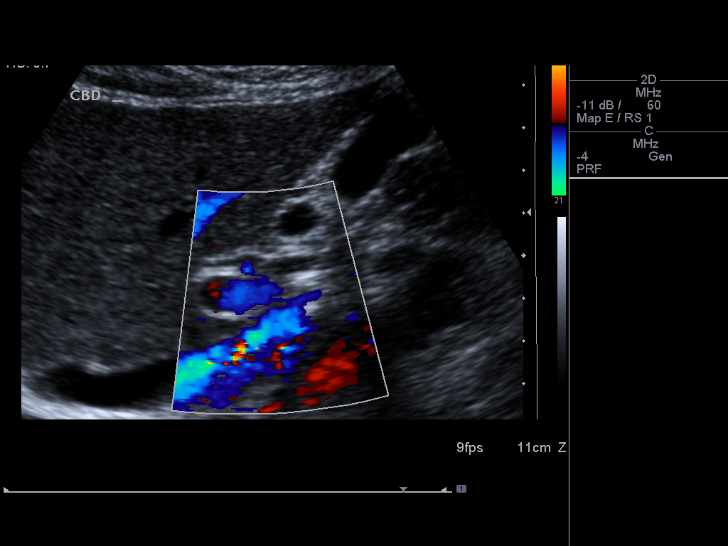
[im 35/47]
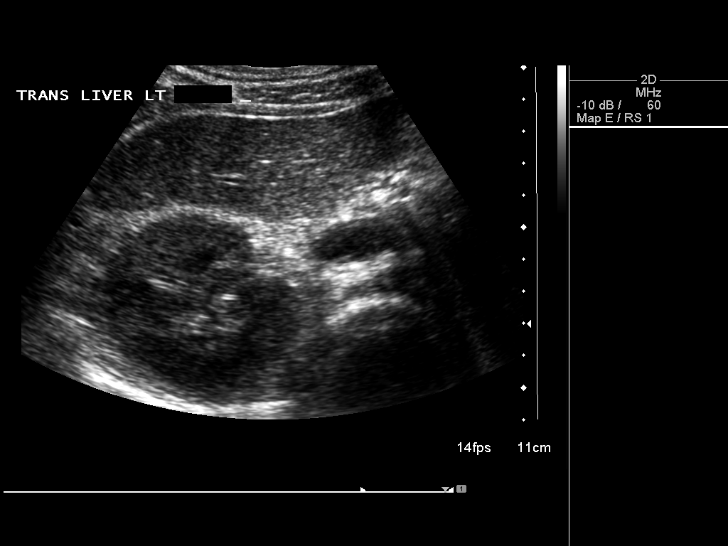
[im 39/47]
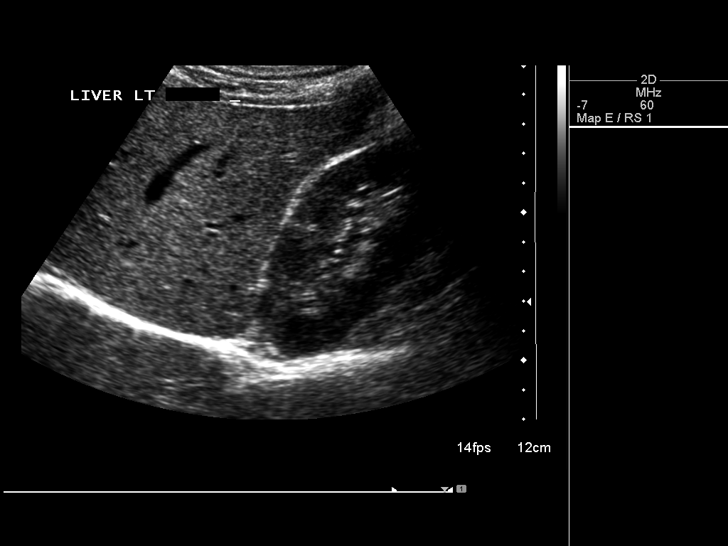
[im 43/47]
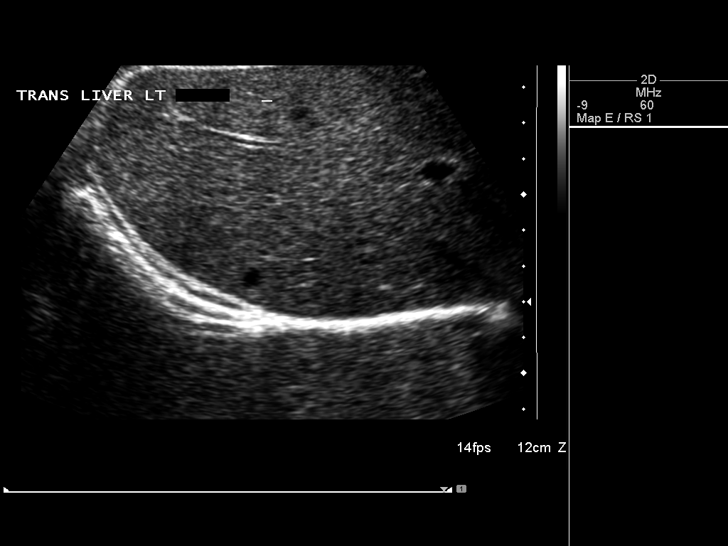
[im 47/47]
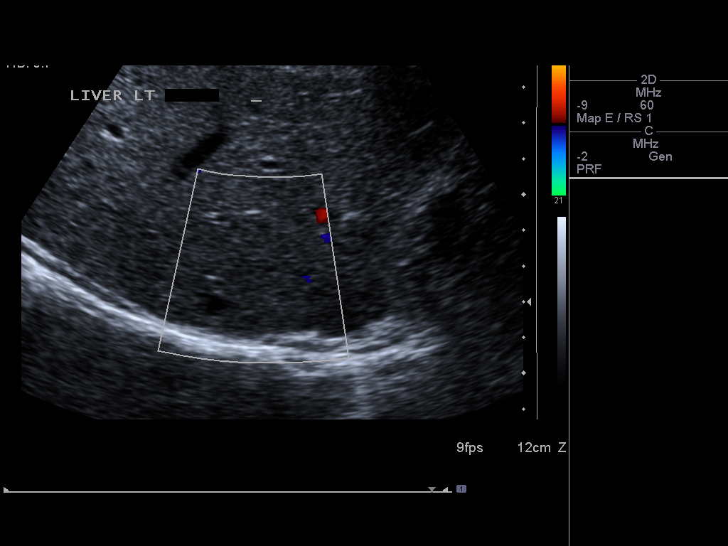

[14 of 25 positions shown; findings below may reference images not displayed]

FINDINGS: Gallbladder:

No gallstones or wall thickening visualized. No sonographic Murphy
sign noted.

Common bile duct:

Diameter: 3 mm, normal

Liver:

Background liver echogenicity is normal. No intrahepatic biliary
ductal dilatation. Possible small simple right lobe liver cyst up to
6 mm (images 44, and 50), with no other discrete liver lesion.

Other findings: Negative visible right kidney
IMPRESSION: Negative right upper quadrant ultrasound. Sonographic appearance of
the liver is within normal limits.

## 2016-08-21 ENCOUNTER — Ambulatory Visit (INDEPENDENT_AMBULATORY_CARE_PROVIDER_SITE_OTHER): Payer: BLUE CROSS/BLUE SHIELD

## 2016-08-21 ENCOUNTER — Ambulatory Visit (INDEPENDENT_AMBULATORY_CARE_PROVIDER_SITE_OTHER): Payer: BLUE CROSS/BLUE SHIELD | Admitting: Family Medicine

## 2016-08-21 VITALS — BP 110/69 | HR 69 | Temp 98.3°F | Resp 17 | Ht 68.5 in | Wt 134.0 lb

## 2016-08-21 DIAGNOSIS — Z0000 Encounter for general adult medical examination without abnormal findings: Secondary | ICD-10-CM | POA: Diagnosis not present

## 2016-08-21 DIAGNOSIS — Z23 Encounter for immunization: Secondary | ICD-10-CM

## 2016-08-21 DIAGNOSIS — R911 Solitary pulmonary nodule: Secondary | ICD-10-CM | POA: Diagnosis not present

## 2016-08-21 DIAGNOSIS — L219 Seborrheic dermatitis, unspecified: Secondary | ICD-10-CM | POA: Diagnosis not present

## 2016-08-21 DIAGNOSIS — Z801 Family history of malignant neoplasm of trachea, bronchus and lung: Secondary | ICD-10-CM

## 2016-08-21 DIAGNOSIS — R05 Cough: Secondary | ICD-10-CM | POA: Diagnosis not present

## 2016-08-21 DIAGNOSIS — Z131 Encounter for screening for diabetes mellitus: Secondary | ICD-10-CM

## 2016-08-21 DIAGNOSIS — R059 Cough, unspecified: Secondary | ICD-10-CM

## 2016-08-21 DIAGNOSIS — B029 Zoster without complications: Secondary | ICD-10-CM | POA: Diagnosis not present

## 2016-08-21 DIAGNOSIS — M549 Dorsalgia, unspecified: Secondary | ICD-10-CM

## 2016-08-21 DIAGNOSIS — E785 Hyperlipidemia, unspecified: Secondary | ICD-10-CM | POA: Diagnosis not present

## 2016-08-21 MED ORDER — CYCLOBENZAPRINE HCL 5 MG PO TABS: ORAL_TABLET | ORAL | 0 refills | Status: DC

## 2016-08-21 MED ORDER — TRIAMCINOLONE ACETONIDE 0.1 % EX CREA
1.0000 | TOPICAL_CREAM | Freq: Two times a day (BID) | CUTANEOUS | 0 refills | Status: DC
Start: 2016-08-21 — End: 2016-08-28

## 2016-08-21 NOTE — Progress Notes (Signed)
Subjective:  By signing my name below, I, Hector Salazar, attest that this documentation has been prepared under the direction and in the presence of Hector Flood, MD Electronically Signed: Charline Bills, ED Scribe 08/21/2016 at 11:51 AM.   Patient ID: Hector Salazar, male    DOB: 1965/11/25, 51 y.o.   MRN: [id number]  Chief Complaint  Patient presents with  . Annual Exam   HPI Hector Salazar is a 51 y.o. male who presents to Primary Care at [facility] for an annual exam. New pt to me. Seen for acute illness last year. Last CPE with Hector Platt, PA-C in 1/16. H/o Hep B per that visit and episodic cough. Also noticed facial rash at that visit. Diagnosed with seborrheic dermatitis and prescribed Kenalog cream. Referred to gastroenterology for Hep B. Pt states that he saw Dr. Elsie Salazar with Hepatology at [facility] once in March 2016 but has not been back since.   Upper Back Pain Pt reports constant left upper back pain onset 5 days ago. He reports increased pain with lifting and deep breaths. Pt has tried applying a topical medicine patch to the area once with mild relief. He denies neck pain, new cough, sob, weakness in the left arm or pain radiating into left arm, numbness/tingling.   Rash Pt states that he is still experiencing an intermittent red, pruritic rash below his mouth and next to his nose. Pt has also noticed a few red bumps over the past week on his right hip area. He states that he has applied Kenalog cream to his face with temporary relief but states that rash returns after ~4 days.   Hyperlipemia  Lab Results  Component Value Date   CHOL 258 (H) 06/22/2014   HDL 67 06/22/2014   LDLCALC 160 (H) 06/22/2014   TRIG 157 (H) 06/22/2014   CHOLHDL 3.9 06/22/2014   Lab Results  Component Value Date   ALT 29 06/22/2014   AST 26 06/22/2014   ALKPHOS 62 06/22/2014   BILITOT 0.5 06/22/2014  recommended change in diet and exercise with repeat testing in 6-8 weeks. Has not had  repeat testing since Jan 2016.  CA Screening Pt would like to wait for prostate screening.  Pt's father has a h/o lung CA but was a nonsmoker. Pt last had a CXR in 2013 which was stable with no active lung disease. Pt reports an occasional dry cough but denies hemoptysis. He is a Investment banker, operational at [employer].   Lab Results  Component Value Date   PSA 0.87 06/22/2014   Immunizations  Immunization History  Administered Date(s) Administered  . Hepatitis A 02/04/2012  . Influenza Split 02/04/2012  Pt is unsure when his last tetanus was and agrees to receive one today.  Pt has not had a flu vaccine. He declines at this time.   Depression Screening Depression screen [facility] 2/9 08/21/2016 07/05/2015 06/21/2015 05/31/2015  Decreased Interest 0 0 0 0  Down, Depressed, Hopeless 0 0 0 0  PHQ - 2 Score 0 0 0 0   Preventative Maintenance  Eye: He plans to see his eye doctor next week.  Visual Acuity Screening   Right eye Left eye Both eyes  Without correction:     With correction: 20/20 20/20 20/20   Dentist: Follows up with his dentist every 6 months.  Exercise: Pt reports daily exercise at work.   Patient Active Problem List   Diagnosis Date Noted  . Hepatitis B 08/14/2011   Past Medical History:  Diagnosis  Date  . Hepatitis B    No past surgical history on file. No Known Allergies Prior to Admission medications   Not on File   Social History   Social History  . Marital status: Married    Spouse name: N/A  . Number of children: N/A  . Years of education: N/A   Occupational History  . Not on file.   Social History Main Topics  . Smoking status: Never Smoker  . Smokeless tobacco: Never Used  . Alcohol use No  . Drug use: No  . Sexual activity: No   Other Topics Concern  . Not on file   Social History Narrative  . No narrative on file   Review of Systems  Respiratory: Positive for cough (occasional). Negative for shortness of breath.   Musculoskeletal: Positive for back pain.  Negative for neck pain.  Skin: Positive for rash.  Neurological: Negative for weakness and numbness.  All other systems reviewed and are negative.     Objective:   Physical Exam  Constitutional: He is oriented to person, place, and time. He appears well-developed and well-nourished.  HENT:  Head: Normocephalic and atraumatic.  Right Ear: External ear normal.  Left Ear: External ear normal.  Mouth/Throat: Oropharynx is clear and moist.  Eyes: Conjunctivae and EOM are normal. Pupils are equal, round, and reactive to light.  Neck: Normal range of motion. Neck supple. No thyromegaly present.  Cardiovascular: Normal rate, regular rhythm, normal heart sounds and intact distal pulses.   Pulmonary/Chest: Effort normal and breath sounds normal. No respiratory distress. He has no wheezes.  Abdominal: Soft. He exhibits no distension. There is no tenderness.  Musculoskeletal: Normal range of motion. He exhibits no edema or tenderness.  c-spine non tender midline. Pain free c-spine ROM. Tender along upper L trapezius and rhomboid. Minimal spasm. UE strength normal. L shoulder non tender, FROM. Full strength.   Lymphadenopathy:    He has no cervical adenopathy.  Neurological: He is alert and oriented to person, place, and time. He has normal reflexes.  Skin: Skin is warm and dry.  Small amount of erythema at the nasal folds. 1 telangiectasia on the L, otherwise slight erythema. Dry scalp laterally. No rash.  Few small papular areas on R lateral hip. 2 scabbed circular areas lower R abdominal wall. No visible vesicles. Similar appearing scabbed areas on midline of back.  Psychiatric: He has a normal mood and affect. His behavior is normal.  Vitals reviewed.  Vitals:   08/21/16 1045  BP: 110/69  Pulse: 69  Resp: 17  Temp: 98.3 F (36.8 C)  TempSrc: Oral  SpO2: 97%  Weight: 134 lb (60.8 kg)  Height: 5' 8.5" (1.74 m)   Dg Chest 2 View  Result Date: 08/21/2016 CLINICAL DATA:  Intermittent  cough and left-sided back pain. History of hepatitis-B. Family history of lung malignancy. EXAM: CHEST  2 VIEW COMPARISON:  PA and lateral chest x-ray of September 9th 2013 FINDINGS: The lungs are well-expanded. There are subcentimeter nodules present in the left upper lobe. There is no alveolar infiltrate or pleural effusion. The heart and pulmonary vascularity are normal. There is no mediastinal nor hilar lymphadenopathy. The bony structures exhibit no acute abnormalities. IMPRESSION: There is no pneumonia nor CHF. Subcentimeter nodules in the left upper lobe appear new but are visible on the frontal view well plain. Chest CT scanning is recommended to evaluate the lung parenchymal more completely to exclude occult malignancy. Electronically Signed   By: David  [place]  M.D.   On: 08/21/2016 12:47      Assessment & Plan:    Hector FlavorsQi Biondo is a 51 y.o. male Annual physical exam  - -anticipatory guidance as below in AVS, screening labs above. Health maintenance items as above in HPI discussed/recommended as applicable.   Seborrheic dermatitis - Plan: triamcinolone cream (KENALOG) 0.1 %  - restart kenalog for up to 2 weeks., Handout given, follow-up within 6 weeks, or sooner if needed to discuss other treatment options. Possible antifungal topical treatment may be needed as well, or dermatology evaluation.  Upper back pain on left side - Plan: DG Chest 2 View, cyclobenzaprine (FLEXERIL) 5 MG tablet   -Appears to be muscular, possible radicular pain/pinch nerve from neck the neck exam is reassuring. Symptomatic care discussed, over-the-counter NSAID, Flexeril 5 mg daily at bedtime when necessary, side effects discussed. Recheck in 2 weeks if not improved  Hyperlipidemia, unspecified hyperlipidemia type - Plan: Lipid panel  -Likely will need to be on statin based on previous readings. Check lipid panel.  Cough - Plan: DG Chest 2 View Family history of lung cancer - Plan: DG Chest 2 View Lung nodule - Plan:  CT Chest W Contrast  -Possible new nodule as above with family history of lung cancer in nonsmoker. Check CT of chest.  Screening for diabetes mellitus - Plan: Comprehensive metabolic panel  Need for Tdap vaccination - Plan: Tdap vaccine greater than or equal to 7yo IM given  Herpes zoster without complication  -Rash on flank appears to be healing rash from herpes zoster. Minimal symptoms, denies pain at this time. No new treatments at this point needed.  History of hepatitis B. Contact information given to hepatologist for him to call to reschedule appointment  Recheck in 6 weeks, sooner if needed.  Meds ordered this encounter  Medications  . triamcinolone cream (KENALOG) 0.1 %    Sig: Apply 1 application topically 2 (two) times daily. Do not place on eyelids.  Do not use more than 2 weeks consistently.    Dispense:  30 g    Refill:  0  . cyclobenzaprine (FLEXERIL) 5 MG tablet    Sig: 1 pill by mouth up to every 8 hours as needed. Start with one pill by mouth each bedtime as needed due to sedation    Dispense:  10 tablet    Refill:  0   Patient Instructions   Steroid cream if needed for rash on face/scalp for up to 2 weeks.  Recheck in 6 weeks, unless rash returns sooner.   The rash on your hip and back appear to be from shingles. It appears to be healing at this time, so no other treatments are needed.  Your back pain is likely due to a pinched nerve and muscle spasm. Heat or ice to that area, Advil or Aleve over-the-counter if needed, and a muscle relaxant was prescribed at bedtime only if needed. That medicine does cause sedation, so use only if needed. If the symptoms are not improving in 2 weeks, return for recheck.  If your cholesterol is elevated again, you will likely need to be on a medicine, but we can call you to discuss that further. Please follow-up in 6 weeks to follow-up on the issues above, sooner if needed  The chest x-ray did show some irregularity or possible  nodules. I will order a CT scan to look at that area further.  For hepatitis B - call your hepatologist office to schedule follow up: [facility] [place] ?  Address: [address], [place], [place] [address]  Phone: [phone]   Shingles Shingles is an infection that causes a painful skin rash and fluid-filled blisters. Shingles is caused by the same virus that causes chickenpox. Shingles only develops in people who:  Have had chickenpox.  Have gotten the chickenpox vaccine. (This is rare.) The first symptoms of shingles may be itching, tingling, or pain in an area on your skin. A rash will follow in a few days or weeks. The rash is usually on one side of the body in a bandlike or beltlike pattern. Over time, the rash turns into fluid-filled blisters that break open, scab over, and dry up. Medicines may:  Help you manage pain.  Help you recover more quickly.  Help to prevent long-term problems. Follow these instructions at home: Medicines   Take medicines only as told by your doctor.  Apply an anti-itch or numbing cream to the affected area as told by your doctor. Blister and Rash Care   Take a cool bath or put cool compresses on the area of the rash or blisters as told by your doctor. This may help with pain and itching.  Keep your rash covered with a loose bandage (dressing). Wear loose-fitting clothing.  Keep your rash and blisters clean with mild soap and cool water or as told by your doctor.  Check your rash every day for signs of infection. These include redness, swelling, and pain that lasts or gets worse.  Do not pick your blisters.  Do not scratch your rash. General instructions   Rest as told by your doctor.  Keep all follow-up visits as told by your doctor. This is important.  Until your blisters scab over, your infection can cause chickenpox in people who have never had it or been vaccinated against it. To prevent this  from happening, avoid touching other people or being around other people, especially:  Babies.  Pregnant women.  Children who have eczema.  Elderly people who have transplants.  People who have chronic illnesses, such as leukemia or AIDS. Contact a doctor if:  Your pain does not get better with medicine.  Your pain does not get better after the rash heals.  Your rash looks infected. Signs of infection include:  Redness.  Swelling.  Pain that lasts or gets worse. Get help right away if:  The rash is on your face or nose.  You have pain in your face, pain around your eye area, or loss of feeling on one side of your face.  You have ear pain or you have ringing in your ear.  You have loss of taste.  Your condition gets worse. This information is not intended to replace advice given to you by your health care provider. Make sure you discuss any questions you have with your health care provider. Document Released: 10/31/2007 Document Revised: 01/08/2016 Document Reviewed: 02/23/2014 Elsevier Interactive Patient Education  2017 [employer].    Keeping you healthy  Get these tests  Blood pressure- Have your blood pressure checked once a year by your healthcare provider.  Normal blood pressure is 120/80.  Weight- Have your body mass index (BMI) calculated to screen for obesity.  BMI is a measure of body fat based on height and weight. You can also calculate your own BMI at https://www.west-esparza.com/.  Cholesterol- Have your cholesterol checked regularly starting at age 47, sooner may be necessary if you have diabetes, high blood pressure,  if a family member developed heart diseases at an early age or if you smoke.   Chlamydia, HIV, and other sexual transmitted disease- Get screened each year until the age of 54 then within three months of each new sexual partner.  Diabetes- Have your blood sugar checked regularly if you have high blood pressure, high cholesterol, a family  history of diabetes or if you are overweight.  Get these vaccines  Flu shot- Every fall.  Tetanus shot- Every 10 years.  Menactra- Single dose; prevents meningitis.  Take these steps  Don't smoke- If you do smoke, ask your healthcare provider about quitting. For tips on how to quit, go to www.smokefree.gov or call 1-800-QUIT-NOW.  Be physically active- Exercise 5 days a week for at least 30 minutes.  If you are not already physically active start slow and gradually work up to 30 minutes of moderate physical activity.  Examples of moderate activity include walking briskly, mowing the yard, dancing, swimming bicycling, etc.  Eat a healthy diet- Eat a variety of healthy foods such as fruits, vegetables, low fat milk, low fat cheese, yogurt, lean meats, poultry, fish, beans, tofu, etc.  For more information on healthy eating, go to www.thenutritionsource.org  Drink alcohol in moderation- Limit alcohol intake two drinks or less a day.  Never drink and drive.  Dentist- Brush and floss teeth twice daily; visit your dentis twice a year.  Depression-Your emotional health is as important as your physical health.  If you're feeling down, losing interest in things you normally enjoy please talk with your healthcare provider.  Gun Safety- If you keep a gun in your home, keep it unloaded and with the safety lock on.  Bullets should be stored separately.  Helmet use- Always wear a helmet when riding a motorcycle, bicycle, rollerblading or skateboarding.  Safe sex- If you may be exposed to a sexually transmitted infection, use a condom  Seat belts- Seat bels can save your life; always wear one.  Smoke/Carbon Monoxide detectors- These detectors need to be installed on the appropriate level of your home.  Replace batteries at least once a year.  Skin Cancer- When out in the sun, cover up and use sunscreen SPF 15 or higher.  Violence- If anyone is threatening or hurting you, please tell your  healthcare provider. Seborrheic Dermatitis, Adult Seborrheic dermatitis is a skin disease that causes red, scaly patches. It usually occurs on the scalp, and it is often called dandruff. The patches may appear on other parts of the body. Skin patches tend to appear where there are many oil glands in the skin. Areas of the body that are commonly affected include:  Scalp.  Skin folds of the body.  Ears.  Eyebrows.  Neck.  Face.  Armpits.  The bearded area of men's faces. The condition may come and go for no known reason, and it is often long-lasting (chronic). What are the causes? The cause of this condition is not known. What increases the risk? This condition is more likely to develop in people who:  Have certain conditions, such as:  HIV (human immunodeficiency virus).  AIDS (acquired immunodeficiency syndrome).  Parkinson disease.  Mood disorders, such as depression.  Are 54-70 years old. What are the signs or symptoms? Symptoms of this condition include:  Thick scales on the scalp.  Redness on the face or in the armpits.  Skin that is flaky. The flakes may be white or yellow.  Skin that seems oily or dry but is not  helped with moisturizers.  Itching or burning in the affected areas. How is this diagnosed? This condition is diagnosed with a medical history and physical exam. A sample of your skin may be tested (skin biopsy). You may need to see a skin specialist (dermatologist). How is this treated? There is no cure for this condition, but treatment can help to manage the symptoms. You may get treatment to remove scales, lower the risk of skin infection, and reduce swelling or itching. Treatment may include:  Creams that reduce swelling and irritation (steroids).  Creams that reduce skin yeast.  Medicated shampoo, soaps, moisturizing creams, or ointments.  Medicated moisturizing creams or ointments. Follow these instructions at home:  Apply  over-the-counter and prescription medicines only as told by your health care provider.  Use any medicated shampoo, soaps, skin creams, or ointments only as told by your health care provider.  Keep all follow-up visits as told by your health care provider. This is important. Contact a health care provider if:  Your symptoms do not improve with treatment.  Your symptoms get worse.  You have new symptoms. This information is not intended to replace advice given to you by your health care provider. Make sure you discuss any questions you have with your health care provider. Document Released: 05/14/2005 Document Revised: 12/02/2015 Document Reviewed: 09/01/2015 Elsevier Interactive Patient Education  2017 Elsevier Inc.   Muscle Cramps and Spasms Muscle cramps and spasms are when muscles tighten by themselves. They usually get better within minutes. Muscle cramps are painful. They are usually stronger and last longer than muscle spasms. Muscle spasms may or may not be painful. They can last a few seconds or much longer. Follow these instructions at home:  Drink enough fluid to keep your pee (urine) clear or pale yellow.  Massage, stretch, and relax the muscle.  If directed, apply heat to tight or tense muscles as often as told by your doctor. Use the heat source that your doctor recommends.  Place a towel between your skin and the heat source.  Leave the heat on for 20-30 minutes.  Take off the heat if your skin turns bright red. This is especially important if you are unable to feel pain, heat, or cold. You may have a greater risk of getting burned.  If directed, put ice on the affected area. This may help if you are sore or have pain after a cramp or spasm.  Put ice in a plastic bag.  Place a towel between your skin and the bag.  Leave the ice on for 20 minutes, 2-3 times a day.  Take over-the-counter and prescription medicines only as told by your doctor.  Pay attention to  any changes in your symptoms. Contact a doctor if:  Your cramps or spasms get worse or happen more often.  Your cramps or spasms do not get better with time. This information is not intended to replace advice given to you by your health care provider. Make sure you discuss any questions you have with your health care provider. Document Released: 04/26/2008 Document Revised: 06/15/2015 Document Reviewed: 02/15/2015 Elsevier Interactive Patient Education  2017 [employer].   IF you received an x-ray today, you will receive an invoice from [facility]. Please contact [facility] at [phone] with questions or concerns regarding your invoice.   IF you received labwork today, you will receive an invoice from [facility]. Please contact [facility] at [phone] with questions or concerns regarding your invoice.   Our billing staff will  not be able to assist you with questions regarding bills from these companies.  You will be contacted with the lab results as soon as they are available. The fastest way to get your results is to activate your My Chart account. Instructions are located on the last page of this paperwork. If you have not heard from [place] regarding the results in 2 weeks, please contact this office.       I personally performed the services described in this documentation, which was scribed in my presence. The recorded information has been reviewed and considered for accuracy and completeness, addended by me as needed, and agree with information above.  Signed,   Meredith Staggers, MD Primary Care at [facility].  08/22/16 10:13 PM

## 2016-08-21 NOTE — Patient Instructions (Addendum)
Steroid cream if needed for rash on face/scalp for up to 2 weeks.  Recheck in 6 weeks, unless rash returns sooner.   The rash on your hip and back appear to be from shingles. It appears to be healing at this time, so no other treatments are needed.  Your back pain is likely due to a pinched nerve and muscle spasm. Heat or ice to that area, Advil or Aleve over-the-counter if needed, and a muscle relaxant was prescribed at bedtime only if needed. That medicine does cause sedation, so use only if needed. If the symptoms are not improving in 2 weeks, return for recheck.  If your cholesterol is elevated again, you will likely need to be on a medicine, but we can call you to discuss that further. Please follow-up in 6 weeks to follow-up on the issues above, sooner if needed  The chest x-ray did show some irregularity or possible nodules. I will order a CT scan to look at that area further.  For hepatitis B - call your hepatologist office to schedule follow up: [facility] [place] ?  Address: [address], [place], [place] [address]  Phone: [phone]   Shingles Shingles is an infection that causes a painful skin rash and fluid-filled blisters. Shingles is caused by the same virus that causes chickenpox. Shingles only develops in people who:  Have had chickenpox.  Have gotten the chickenpox vaccine. (This is rare.) The first symptoms of shingles may be itching, tingling, or pain in an area on your skin. A rash will follow in a few days or weeks. The rash is usually on one side of the body in a bandlike or beltlike pattern. Over time, the rash turns into fluid-filled blisters that break open, scab over, and dry up. Medicines may:  Help you manage pain.  Help you recover more quickly.  Help to prevent long-term problems. Follow these instructions at home: Medicines   Take medicines only as told by your doctor.  Apply an anti-itch or numbing cream to  the affected area as told by your doctor. Blister and Rash Care   Take a cool bath or put cool compresses on the area of the rash or blisters as told by your doctor. This may help with pain and itching.  Keep your rash covered with a loose bandage (dressing). Wear loose-fitting clothing.  Keep your rash and blisters clean with mild soap and cool water or as told by your doctor.  Check your rash every day for signs of infection. These include redness, swelling, and pain that lasts or gets worse.  Do not pick your blisters.  Do not scratch your rash. General instructions   Rest as told by your doctor.  Keep all follow-up visits as told by your doctor. This is important.  Until your blisters scab over, your infection can cause chickenpox in people who have never had it or been vaccinated against it. To prevent this from happening, avoid touching other people or being around other people, especially:  Babies.  Pregnant women.  Children who have eczema.  Elderly people who have transplants.  People who have chronic illnesses, such as leukemia or AIDS. Contact a doctor if:  Your pain does not get better with medicine.  Your pain does not get better after the rash heals.  Your rash looks infected. Signs of infection include:  Redness.  Swelling.  Pain that lasts or gets worse. Get help right away if:  The  rash is on your face or nose.  You have pain in your face, pain around your eye area, or loss of feeling on one side of your face.  You have ear pain or you have ringing in your ear.  You have loss of taste.  Your condition gets worse. This information is not intended to replace advice given to you by your health care provider. Make sure you discuss any questions you have with your health care provider. Document Released: 10/31/2007 Document Revised: 01/08/2016 Document Reviewed: 02/23/2014 Elsevier Interactive Patient Education  2017 [employer].    Keeping  you healthy  Get these tests  Blood pressure- Have your blood pressure checked once a year by your healthcare provider.  Normal blood pressure is 120/80.  Weight- Have your body mass index (BMI) calculated to screen for obesity.  BMI is a measure of body fat based on height and weight. You can also calculate your own BMI at https://www.west-esparza.com/.  Cholesterol- Have your cholesterol checked regularly starting at age 68, sooner may be necessary if you have diabetes, high blood pressure, if a family member developed heart diseases at an early age or if you smoke.   Chlamydia, HIV, and other sexual transmitted disease- Get screened each year until the age of 59 then within three months of each new sexual partner.  Diabetes- Have your blood sugar checked regularly if you have high blood pressure, high cholesterol, a family history of diabetes or if you are overweight.  Get these vaccines  Flu shot- Every fall.  Tetanus shot- Every 10 years.  Menactra- Single dose; prevents meningitis.  Take these steps  Don't smoke- If you do smoke, ask your healthcare provider about quitting. For tips on how to quit, go to www.smokefree.gov or call 1-800-QUIT-NOW.  Be physically active- Exercise 5 days a week for at least 30 minutes.  If you are not already physically active start slow and gradually work up to 30 minutes of moderate physical activity.  Examples of moderate activity include walking briskly, mowing the yard, dancing, swimming bicycling, etc.  Eat a healthy diet- Eat a variety of healthy foods such as fruits, vegetables, low fat milk, low fat cheese, yogurt, lean meats, poultry, fish, beans, tofu, etc.  For more information on healthy eating, go to www.thenutritionsource.org  Drink alcohol in moderation- Limit alcohol intake two drinks or less a day.  Never drink and drive.  Dentist- Brush and floss teeth twice daily; visit your dentis twice a year.  Depression-Your emotional health is  as important as your physical health.  If you're feeling down, losing interest in things you normally enjoy please talk with your healthcare provider.  Gun Safety- If you keep a gun in your home, keep it unloaded and with the safety lock on.  Bullets should be stored separately.  Helmet use- Always wear a helmet when riding a motorcycle, bicycle, rollerblading or skateboarding.  Safe sex- If you may be exposed to a sexually transmitted infection, use a condom  Seat belts- Seat bels can save your life; always wear one.  Smoke/Carbon Monoxide detectors- These detectors need to be installed on the appropriate level of your home.  Replace batteries at least once a year.  Skin Cancer- When out in the sun, cover up and use sunscreen SPF 15 or higher.  Violence- If anyone is threatening or hurting you, please tell your healthcare provider. Seborrheic Dermatitis, Adult Seborrheic dermatitis is a skin disease that causes red, scaly patches. It usually occurs on  the scalp, and it is often called dandruff. The patches may appear on other parts of the body. Skin patches tend to appear where there are many oil glands in the skin. Areas of the body that are commonly affected include:  Scalp.  Skin folds of the body.  Ears.  Eyebrows.  Neck.  Face.  Armpits.  The bearded area of men's faces. The condition may come and go for no known reason, and it is often long-lasting (chronic). What are the causes? The cause of this condition is not known. What increases the risk? This condition is more likely to develop in people who:  Have certain conditions, such as:  HIV (human immunodeficiency virus).  AIDS (acquired immunodeficiency syndrome).  Parkinson disease.  Mood disorders, such as depression.  Are 50-49 years old. What are the signs or symptoms? Symptoms of this condition include:  Thick scales on the scalp.  Redness on the face or in the armpits.  Skin that is flaky. The  flakes may be white or yellow.  Skin that seems oily or dry but is not helped with moisturizers.  Itching or burning in the affected areas. How is this diagnosed? This condition is diagnosed with a medical history and physical exam. A sample of your skin may be tested (skin biopsy). You may need to see a skin specialist (dermatologist). How is this treated? There is no cure for this condition, but treatment can help to manage the symptoms. You may get treatment to remove scales, lower the risk of skin infection, and reduce swelling or itching. Treatment may include:  Creams that reduce swelling and irritation (steroids).  Creams that reduce skin yeast.  Medicated shampoo, soaps, moisturizing creams, or ointments.  Medicated moisturizing creams or ointments. Follow these instructions at home:  Apply over-the-counter and prescription medicines only as told by your health care provider.  Use any medicated shampoo, soaps, skin creams, or ointments only as told by your health care provider.  Keep all follow-up visits as told by your health care provider. This is important. Contact a health care provider if:  Your symptoms do not improve with treatment.  Your symptoms get worse.  You have new symptoms. This information is not intended to replace advice given to you by your health care provider. Make sure you discuss any questions you have with your health care provider. Document Released: 05/14/2005 Document Revised: 12/02/2015 Document Reviewed: 09/01/2015 Elsevier Interactive Patient Education  2017 Elsevier Inc.   Muscle Cramps and Spasms Muscle cramps and spasms are when muscles tighten by themselves. They usually get better within minutes. Muscle cramps are painful. They are usually stronger and last longer than muscle spasms. Muscle spasms may or may not be painful. They can last a few seconds or much longer. Follow these instructions at home:  Drink enough fluid to keep your  pee (urine) clear or pale yellow.  Massage, stretch, and relax the muscle.  If directed, apply heat to tight or tense muscles as often as told by your doctor. Use the heat source that your doctor recommends.  Place a towel between your skin and the heat source.  Leave the heat on for 20-30 minutes.  Take off the heat if your skin turns bright red. This is especially important if you are unable to feel pain, heat, or cold. You may have a greater risk of getting burned.  If directed, put ice on the affected area. This may help if you are sore or have pain after a  cramp or spasm.  Put ice in a plastic bag.  Place a towel between your skin and the bag.  Leave the ice on for 20 minutes, 2-3 times a day.  Take over-the-counter and prescription medicines only as told by your doctor.  Pay attention to any changes in your symptoms. Contact a doctor if:  Your cramps or spasms get worse or happen more often.  Your cramps or spasms do not get better with time. This information is not intended to replace advice given to you by your health care provider. Make sure you discuss any questions you have with your health care provider. Document Released: 04/26/2008 Document Revised: 06/15/2015 Document Reviewed: 02/15/2015 Elsevier Interactive Patient Education  2017 [employer]   IF you received an x-ray today, you will receive an invoice from [facility]. Please contact [facility] at [phone] with questions or concerns regarding your invoice.   IF you received labwork today, you will receive an invoice from [facility]. Please contact [facility] at [phone] with questions or concerns regarding your invoice.   Our billing staff will not be able to assist you with questions regarding bills from these companies.  You will be contacted with the lab results as soon as they are available. The fastest way to get your results is to activate your My Chart account. Instructions  are located on the last page of this paperwork. If you have not heard from us regarding the results in 2 weeks, please contact this office.

## 2016-08-22 LAB — COMPREHENSIVE METABOLIC PANEL
ALK PHOS: 55 IU/L (ref 39–117)
ALT: 17 IU/L (ref 0–44)
AST: 24 IU/L (ref 0–40)
Albumin/Globulin Ratio: 2.1 (ref 1.2–2.2)
Albumin: 4.8 g/dL (ref 3.5–5.5)
BUN/Creatinine Ratio: 19 (ref 9–20)
BUN: 14 mg/dL (ref 6–24)
Bilirubin Total: 0.4 mg/dL (ref 0.0–1.2)
CALCIUM: 9.6 mg/dL (ref 8.7–10.2)
CO2: 26 mmol/L (ref 18–29)
CREATININE: 0.73 mg/dL — AB (ref 0.76–1.27)
Chloride: 100 mmol/L (ref 96–106)
GFR calc Af Amer: 126 mL/min/{1.73_m2} (ref >59)
GFR, EST NON AFRICAN AMERICAN: 109 mL/min/{1.73_m2} (ref >59)
GLUCOSE: 89 mg/dL (ref 65–99)
Globulin, Total: 2.3 g/dL (ref 1.5–4.5)
Potassium: 4.5 mmol/L (ref 3.5–5.2)
Sodium: 142 mmol/L (ref 134–144)
Total Protein: 7.1 g/dL (ref 6.0–8.5)

## 2016-08-22 LAB — LIPID PANEL
Chol/HDL Ratio: 3.6 ratio units (ref 0.0–5.0)
Cholesterol, Total: 224 mg/dL — ABNORMAL HIGH (ref 100–199)
HDL: 63 mg/dL (ref >39)
LDL CALC: 133 mg/dL — AB (ref 0–99)
Triglycerides: 140 mg/dL (ref 0–149)
VLDL CHOLESTEROL CAL: 28 mg/dL (ref 5–40)

## 2016-08-28 ENCOUNTER — Ambulatory Visit
Admission: RE | Admit: 2016-08-28 | Discharge: 2016-08-28 | Disposition: A | Discharge status: Home / Self Care | Payer: BLUE CROSS/BLUE SHIELD | Source: Ambulatory Visit | Attending: Family Medicine | Admitting: Family Medicine

## 2016-08-28 ENCOUNTER — Ambulatory Visit (INDEPENDENT_AMBULATORY_CARE_PROVIDER_SITE_OTHER): Payer: BLUE CROSS/BLUE SHIELD | Admitting: Family Medicine

## 2016-08-28 VITALS — BP 104/68 | HR 67 | Temp 98.2°F | Resp 18 | Ht 68.5 in | Wt 138.0 lb

## 2016-08-28 DIAGNOSIS — J029 Acute pharyngitis, unspecified: Secondary | ICD-10-CM | POA: Diagnosis not present

## 2016-08-28 DIAGNOSIS — R059 Cough, unspecified: Secondary | ICD-10-CM

## 2016-08-28 DIAGNOSIS — R05 Cough: Secondary | ICD-10-CM

## 2016-08-28 DIAGNOSIS — R911 Solitary pulmonary nodule: Secondary | ICD-10-CM

## 2016-08-28 DIAGNOSIS — J8410 Pulmonary fibrosis, unspecified: Secondary | ICD-10-CM | POA: Diagnosis not present

## 2016-08-28 DIAGNOSIS — J208 Acute bronchitis due to other specified organisms: Secondary | ICD-10-CM

## 2016-08-28 MED ORDER — IOPAMIDOL (ISOVUE-300) INJECTION 61%
75.0000 mL | Freq: Once | INTRAVENOUS | Status: AC | PRN
  Administered 2016-08-28: 75 mL via INTRAVENOUS

## 2016-08-28 MED ORDER — BENZONATATE 100 MG PO CAPS: 100.0000 mg | ORAL_CAPSULE | Freq: Three times a day (TID) | ORAL | 0 refills | Status: AC | PRN

## 2016-08-28 MED ORDER — HYDROCODONE-HOMATROPINE 5-1.5 MG/5ML PO SYRP: 5.0000 mL | ORAL_SOLUTION | ORAL | 0 refills | Status: AC | PRN

## 2016-08-28 NOTE — Patient Instructions (Addendum)
Continue taking Tylenol (acetaminophen) 500 mg 2 pills 3 times daily if needed for throat pain  Drink lots of fluids  Take the benzonatate cough pills one or 2 pills 3 times daily as needed for cough  Take the hydrocodone cough syrup 1 teaspoon every 4-6 hours as needed for cough. This may make you drowsy and you may not want to take it when you're working if it makes you to drowsy.  Return if worse  We are doing a blood test on you to screen for possible exposure to tuberculosis which might have caused the old granuloma on your lung. The CT scan looks like this is probably an old scar, often seen in people that were exposed to tuberculosis when they were young. I will try to communicate with Dr. Neva Seat about this further.  Return if not improving.   IF you received an x-ray today, you will receive an invoice from [facility]. Please contact [facility] at [phone] with questions or concerns regarding your invoice.   IF you received labwork today, you will receive an invoice from [facility]. Please contact [facility] at [phone] with questions or concerns regarding your invoice.   Our billing staff will not be able to assist you with questions regarding bills from these companies.  You will be contacted with the lab results as soon as they are available. The fastest way to get your results is to activate your My Chart account. Instructions are located on the last page of this paperwork. If you have not heard from [place] regarding the results in 2 weeks, please contact this office.

## 2016-08-28 NOTE — Progress Notes (Signed)
Patient ID: Hector Salazar, male    DOB: 09/30/1965  Age: 51 y.o. MRN: [id number]  Chief Complaint  Patient presents with  . Cough  . Sore Throat    Subjective:   Patient is here with a three-day history of having a sore throat. He had a little runny nose started out with then the sore throat. Then he developed a bad cough which is been bothering him and keep him awake. 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 He does not smoke. His father had lung cancer, not related to smoking. The patient had a little spot on his chest x-ray noted on his physical examination last week and the CT scan was done today. He is very anxious in morning the report from that.  Current allergies, medications, problem list, past/family and social histories reviewed.  Objective:  BP 104/68   Pulse 67   Temp 98.2 F (36.8 C) (Oral)   Resp 18   Ht 5' 8.5" (1.74 m)   Wt 138 lb (62.6 kg)   SpO2 94%   BMI 20.68 kg/m   TMs are normal. Nose clear. Throat not erythematous. Neck supple without significant nodes. Chest clear to auscultation. Heart regular without murmurs. He does have a moderate cough.  Assessment & Plan:   Assessment: 1. Viral pharyngitis   2. Viral bronchitis   3. Cough   4. Pulmonary granuloma (HCC)       Plan: With having been raised in [place] I think it would be good to know if this was a tuberculosis granuloma, so will check a QuantiFERON Gold. I felt like he probably had BCG sometime when he was young and the PPD might not be helpful so went straight to this.  Orders Placed This Encounter  Procedures  . Quantiferon tb gold assay (blood)    Meds ordered this encounter  Medications  . Acetaminophen (TYLENOL 8 HOUR PO)    Sig: Take by mouth.  Marland Kitchen HYDROcodone-homatropine (HYCODAN) 5-1.5 MG/5ML syrup    Sig: Take 5 mLs by mouth every 4 (four) hours as needed.    Dispense:  120 mL    Refill:  0  . benzonatate (TESSALON) 100 MG capsule    Sig: Take 1-2 capsules (100-200 mg total) by mouth 3  (three) times daily as needed.    Dispense:  30 capsule    Refill:  0         Patient Instructions   Continue taking Tylenol (acetaminophen) 500 mg 2 pills 3 times daily if needed for throat pain  Drink lots of fluids  Take the benzonatate cough pills one or 2 pills 3 times daily as needed for cough  Take the hydrocodone cough syrup 1 teaspoon every 4-6 hours as needed for cough. This may make you drowsy and you may not want to take it when you're working if it makes you to drowsy.  Return if worse  We are doing a blood test on you to screen for possible exposure to tuberculosis which might have caused the old granuloma on your lung. The CT scan looks like this is probably an old scar, often seen in people that were exposed to tuberculosis when they were young. I will try to communicate with Dr. Neva Seat about this further.  Return if not improving.   IF you received an x-ray today, you will receive an invoice from [facility]. Please contact [facility] at [phone] with questions or concerns regarding your invoice.   IF you received labwork today, you  will receive an invoice from [facility]. Please contact [facility] at [phone] with questions or concerns regarding your invoice.   Our billing staff will not be able to assist you with questions regarding bills from these companies.  You will be contacted with the lab results as soon as they are available. The fastest way to get your results is to activate your My Chart account. Instructions are located on the last page of this paperwork. If you have not heard from [place] regarding the results in 2 weeks, please contact this office.        Return if symptoms worsen or fail to improve.   Alonie Gazzola, MD 08/28/2016

## 2016-08-30 LAB — QUANTIFERON IN TUBE
QFT TB AG MINUS NIL VALUE: 0 [IU]/mL
QUANTIFERON NIL VALUE: 0.07 [IU]/mL
QUANTIFERON TB AG VALUE: 0.07 [IU]/mL
QUANTIFERON TB GOLD: NEGATIVE

## 2016-08-30 LAB — QUANTIFERON TB GOLD ASSAY (BLOOD)

## 2016-09-04 ENCOUNTER — Ambulatory Visit (INDEPENDENT_AMBULATORY_CARE_PROVIDER_SITE_OTHER): Payer: BLUE CROSS/BLUE SHIELD | Admitting: Physician Assistant

## 2016-09-04 VITALS — BP 132/83 | HR 72 | Temp 98.3°F | Resp 16 | Ht 67.5 in | Wt 132.8 lb

## 2016-09-04 DIAGNOSIS — S61412A Laceration without foreign body of left hand, initial encounter: Secondary | ICD-10-CM | POA: Diagnosis not present

## 2016-09-04 NOTE — Progress Notes (Signed)
  09/04/2016 4:11 PM   DOB: July 08, 1965 / MRN: [id number]  SUBJECTIVE:  Hector Salazar is a 51 y.o. male presenting for a laceration sustained yesterday around 3 pm to the left middle finger.  He wash and wrapped up the finger immediately and kept the bandage on until coming here.  Denies any loss of function.   He has No Known Allergies.   Immunization History  Administered Date(s) Administered  . Hepatitis A 02/04/2012  . Influenza Split 02/04/2012  . Tdap 08/21/2016     He  has a past medical history of Hepatitis B.    He  reports that he has never smoked. He has never used smokeless tobacco. He reports that he does not drink alcohol or use drugs. He  reports that he does not engage in sexual activity. The patient  has no past surgical history on file.  His family history includes Cancer in his father, maternal grandfather, and paternal grandmother.  Review of Systems  Neurological: Negative for dizziness.  Endo/Heme/Allergies: Does not bruise/bleed easily.    The problem list and medications were reviewed and updated by myself where necessary and exist elsewhere in the encounter.   OBJECTIVE:  BP 132/83 (BP Location: Right Arm, Patient Position: Sitting, Cuff Size: Normal)   Pulse 72   Temp 98.3 F (36.8 C) (Oral)   Resp 16   Ht 5' 7.5" (1.715 m)   Wt 132 lb 12.8 oz (60.2 kg)   SpO2 95%   BMI 20.49 kg/m   Physical Exam  Constitutional: He is active and cooperative.  Cardiovascular: Normal rate.   Pulmonary/Chest: Effort normal. No tachypnea.  Musculoskeletal: Normal range of motion. He exhibits no deformity.  Neurological: He is alert.  Skin: Skin is warm.  Vitals reviewed.       No results found for this or any previous visit (from the past 72 hour(s)).  No results found.  ASSESSMENT AND PLAN:  Hector Salazar was seen today for laceration.  Diagnoses and all orders for this visit:  Laceration of left hand, foreign body presence unspecified, initial encounter: The skin  has adheared to the wound due to the pressure bandage he placed.  Advised that given this adhearnce and the fact that the wound is somewhat old sutures are not in his best interest.  Advised of infectious symptoms and RTC if that is the case.  I have given him some mupirocin in a sterile container to apply daily.  He will come back if needed.     The patient is advised to call or return to clinic if he does not see an improvement in symptoms, or to seek the care of the closest emergency department if he worsens with the above plan.   Deliah Boston, MHS, PA-C [facility] 09/04/2016 4:11 PM

## 2016-09-04 NOTE — Patient Instructions (Addendum)
  Change the wound dialy for ten days and apply the ointment and bandages as shown in the office.  If any worsening pain, pus or redness going up your arm then please come back to the clinic immediately.   WOUND CARE  . Keep area clean and dry for 24 hours. Do not remove bandage, if applied. . After 24 hours, remove bandage and wash wound gently with mild soap and warm water. Reapply a new bandage after cleaning wound, if directed. . Continue daily cleansing with soap and water until stitches/staples are removed. . Do not apply any ointments or creams to the wound while stitches/staples are in place, as this may cause delayed healing. . Notify the office if you experience any of the following signs of infection: Swelling, redness, pus drainage, streaking, fever >101.0 F . Notify the office if you experience excessive bleeding that does not stop after 15-20 minutes of constant, firm pressure.     IF you received an x-ray today, you will receive an invoice from [facility]. Please contact [facility] at [phone] with questions or concerns regarding your invoice.   IF you received labwork today, you will receive an invoice from [facility]. Please contact [facility] at [phone] with questions or concerns regarding your invoice.   Our billing staff will not be able to assist you with questions regarding bills from these companies.  You will be contacted with the lab results as soon as they are available. The fastest way to get your results is to activate your My Chart account. Instructions are located on the last page of this paperwork. If you have not heard from [place] regarding the results in 2 weeks, please contact this office.

## 2016-10-02 ENCOUNTER — Encounter: Payer: Self-pay | Admitting: *Deleted
